# Patient Record
Sex: Female | Born: 1990 | Race: Black or African American | Hispanic: No | Marital: Single | Smoking: Current every day smoker
Health system: Southern US, Community
[De-identification: ages and names within clinical notes are randomized; demographics above are authoritative.]

## PROBLEM LIST (undated history)

## (undated) DIAGNOSIS — J45909 Unspecified asthma, uncomplicated: Secondary | ICD-10-CM

---
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
## 2012-07-11 ENCOUNTER — Encounter (HOSPITAL_COMMUNITY): Payer: Self-pay | Admitting: *Deleted

## 2012-07-11 ENCOUNTER — Emergency Department (HOSPITAL_COMMUNITY)
Admission: EM | Admit: 2012-07-11 | Discharge: 2012-07-11 | Disposition: A | Discharge status: Home / Self Care | Payer: Self-pay | Attending: Emergency Medicine | Admitting: Emergency Medicine

## 2012-07-11 DIAGNOSIS — K089 Disorder of teeth and supporting structures, unspecified: Secondary | ICD-10-CM | POA: Insufficient documentation

## 2012-07-11 DIAGNOSIS — K0889 Other specified disorders of teeth and supporting structures: Secondary | ICD-10-CM

## 2012-07-11 MED ORDER — HYDROCODONE-ACETAMINOPHEN 5-325 MG PO TABS: 1.0000 | ORAL_TABLET | ORAL | Status: DC | PRN

## 2012-07-11 MED ORDER — PENICILLIN V POTASSIUM 500 MG PO TABS: 500.0000 mg | ORAL_TABLET | Freq: Three times a day (TID) | ORAL | Status: DC

## 2012-07-11 MED ORDER — OXYCODONE-ACETAMINOPHEN 5-325 MG PO TABS
1.0000 | ORAL_TABLET | Freq: Once | ORAL | Status: AC
  Filled 2012-07-11: qty 1

## 2012-07-11 NOTE — ED Notes (Signed)
Pt c/o left lower dental pain since this afternoon.  States she has had a cyst in the same area before.

## 2012-07-11 NOTE — ED Provider Notes (Signed)
History     CSN: [id number]  Arrival date & time 07/11/12  7829   First MD Initiated Contact with Patient 07/11/12 0257      Chief Complaint  Patient presents with  . Dental Pain    (Consider location/radiation/quality/duration/timing/severity/associated sxs/prior treatment) HPI History provided by pt.   Pt has had severe pain in left lower tooth since 7pm yesterday.   Has not taken anything for pain.  No associated fever.  Had similar pain several months ago, was diagnosed w/ a dental abscess and followed up with a dentist but has to reschedule her tooth extraction.   History reviewed. No pertinent past medical history.  History reviewed. No pertinent past surgical history.  History reviewed. No pertinent family history.  History  Substance Use Topics  . Smoking status: Never Smoker   . Smokeless tobacco: Not on file  . Alcohol Use: Yes    OB History    Grav Para Term Preterm Abortions TAB SAB Ect Mult Living                  Review of Systems  All other systems reviewed and are negative.    Allergies  Review of patient's allergies indicates no known allergies.  Home Medications   Current Outpatient Rx  Name  Route  Sig  Dispense  Refill  . HYDROCODONE-ACETAMINOPHEN 5-325 MG PO TABS   Oral   Take 1 tablet by mouth every 4 (four) hours as needed for pain.   15 tablet   0   . PENICILLIN V POTASSIUM 500 MG PO TABS   Oral   Take 1 tablet (500 mg total) by mouth 3 (three) times daily.   30 tablet   0     BP 128/94  Pulse 91  Temp 98.4 F (36.9 C) (Oral)  Resp 18  SpO2 100%  Physical Exam  Nursing note and vitals reviewed. Constitutional: She is oriented to person, place, and time. She appears well-developed and well-nourished.  HENT:  Head: Normocephalic and atraumatic. No trismus in the jaw.  Mouth/Throat: Uvula is midline and oropharynx is clear and moist.       Left lower 2nd molar w/ multiple small caries.  Severely ttp. Adjacent gingiva  appears normal.  No edema of buccal mucosa.    Eyes:       Normal appearance  Neck: Normal range of motion. Neck supple.       No submandibular edema  Lymphadenopathy:    She has cervical adenopathy.  Neurological: She is alert and oriented to person, place, and time.  Psychiatric: She has a normal mood and affect. Her behavior is normal.    ED Course  Dental Date/Time: 07/11/2012 4:20 AM Performed by: Otilio Miu Authorized by: Ruby Cola E Consent: Verbal consent obtained. Risks and benefits: risks, benefits and alternatives were discussed Consent given by: patient Local anesthesia used: yes Anesthesia: local infiltration Local anesthetic: bupivacaine 0.5% without epinephrine Anesthetic total: 3 ml Patient sedated: no Patient tolerance: Patient tolerated the procedure well with no immediate complications.   (including critical care time)  Labs Reviewed - No data to display No results found.   1. Pain, dental       MDM  21yo F presents w/ dental pain.  Local bupivicaine administered which provided her with relief.  Will treat for likely periapical abscess w/ penicillin.  Prescribed vicodin for pain.  She has a dentist to f/u with. Return precautions discussed.  Otilio Miu, [place] 07/11/12 [phone]

## 2012-07-11 NOTE — ED Provider Notes (Signed)
Medical screening examination/treatment/procedure(s) were performed by non-physician practitioner and as supervising physician I was immediately available for consultation/collaboration.  Anshul Meddings, MD 07/11/12 0637 

## 2014-10-17 ENCOUNTER — Encounter (HOSPITAL_COMMUNITY): Payer: Self-pay | Admitting: Emergency Medicine

## 2014-10-17 ENCOUNTER — Emergency Department (HOSPITAL_COMMUNITY)
Admission: EM | Admit: 2014-10-17 | Discharge: 2014-10-18 | Disposition: A | Discharge status: Home / Self Care | Payer: Self-pay | Attending: Emergency Medicine | Admitting: Emergency Medicine

## 2014-10-17 DIAGNOSIS — R109 Unspecified abdominal pain: Secondary | ICD-10-CM | POA: Insufficient documentation

## 2014-10-17 DIAGNOSIS — R509 Fever, unspecified: Secondary | ICD-10-CM | POA: Insufficient documentation

## 2014-10-17 DIAGNOSIS — J45909 Unspecified asthma, uncomplicated: Secondary | ICD-10-CM | POA: Insufficient documentation

## 2014-10-17 DIAGNOSIS — Z3202 Encounter for pregnancy test, result negative: Secondary | ICD-10-CM | POA: Insufficient documentation

## 2014-10-17 DIAGNOSIS — R112 Nausea with vomiting, unspecified: Secondary | ICD-10-CM | POA: Insufficient documentation

## 2014-10-17 DIAGNOSIS — Z720 Tobacco use: Secondary | ICD-10-CM | POA: Insufficient documentation

## 2014-10-17 HISTORY — DX: Unspecified asthma, uncomplicated: J45.909

## 2014-10-17 LAB — COMPREHENSIVE METABOLIC PANEL
ALT: 17 U/L (ref 0–35)
ANION GAP: 4 — AB (ref 5–15)
AST: 21 U/L (ref 0–37)
Albumin: 3.9 g/dL (ref 3.5–5.2)
Alkaline Phosphatase: 68 U/L (ref 39–117)
BILIRUBIN TOTAL: 0.4 mg/dL (ref 0.3–1.2)
BUN: 9 mg/dL (ref 6–23)
CALCIUM: 9.1 mg/dL (ref 8.4–10.5)
CO2: 31 mmol/L (ref 19–32)
Chloride: 102 mmol/L (ref 96–112)
Creatinine, Ser: 0.92 mg/dL (ref 0.50–1.10)
GFR calc non Af Amer: 87 mL/min — ABNORMAL LOW (ref >90)
Glucose, Bld: 91 mg/dL (ref 70–99)
Potassium: 3.4 mmol/L — ABNORMAL LOW (ref 3.5–5.1)
SODIUM: 137 mmol/L (ref 135–145)
TOTAL PROTEIN: 7.5 g/dL (ref 6.0–8.3)

## 2014-10-17 LAB — URINALYSIS, ROUTINE W REFLEX MICROSCOPIC
BILIRUBIN URINE: NEGATIVE
GLUCOSE, UA: NEGATIVE mg/dL
HGB URINE DIPSTICK: NEGATIVE
Ketones, ur: NEGATIVE mg/dL
Nitrite: NEGATIVE
PH: 6.5 (ref 5.0–8.0)
Protein, ur: NEGATIVE mg/dL
Specific Gravity, Urine: 1.021 (ref 1.005–1.030)
Urobilinogen, UA: 0.2 mg/dL (ref 0.0–1.0)

## 2014-10-17 LAB — CBC WITH DIFFERENTIAL/PLATELET
Basophils Absolute: 0 10*3/uL (ref 0.0–0.1)
Basophils Relative: 0 % (ref 0–1)
EOS PCT: 3 % (ref 0–5)
Eosinophils Absolute: 0.2 10*3/uL (ref 0.0–0.7)
HEMATOCRIT: 39.8 % (ref 36.0–46.0)
HEMOGLOBIN: 12.7 g/dL (ref 12.0–15.0)
LYMPHS PCT: 43 % (ref 12–46)
Lymphs Abs: 2.8 10*3/uL (ref 0.7–4.0)
MCH: 27 pg (ref 26.0–34.0)
MCHC: 31.9 g/dL (ref 30.0–36.0)
MCV: 84.7 fL (ref 78.0–100.0)
MONO ABS: 0.5 10*3/uL (ref 0.1–1.0)
MONOS PCT: 8 % (ref 3–12)
NEUTROS ABS: 3 10*3/uL (ref 1.7–7.7)
Neutrophils Relative %: 46 % (ref 43–77)
Platelets: 250 10*3/uL (ref 150–400)
RBC: 4.7 MIL/uL (ref 3.87–5.11)
RDW: 14.2 % (ref 11.5–15.5)
WBC: 6.5 10*3/uL (ref 4.0–10.5)

## 2014-10-17 LAB — URINE MICROSCOPIC-ADD ON

## 2014-10-17 LAB — LIPASE, BLOOD: Lipase: 25 U/L (ref 11–59)

## 2014-10-17 LAB — POC URINE PREG, ED: PREG TEST UR: NEGATIVE

## 2014-10-17 MED ORDER — ONDANSETRON 4 MG PO TBDP: 4.0000 mg | ORAL_TABLET | Freq: Three times a day (TID) | ORAL | Status: DC | PRN

## 2014-10-17 MED ORDER — OMEPRAZOLE 20 MG PO CPDR: 20.0000 mg | DELAYED_RELEASE_CAPSULE | Freq: Every day | ORAL | Status: DC

## 2014-10-17 NOTE — Discharge Instructions (Signed)
Please follow up with your primary care physician in 1-2 days. If you do not have one please call the [facility] number listed above. Please read all discharge instructions and return precautions.  ° ° °Nausea and Vomiting °Nausea is a sick feeling that often comes before throwing up (vomiting). Vomiting is a reflex where stomach contents come out of your mouth. Vomiting can cause severe loss of body fluids (dehydration). Children and elderly adults can become dehydrated quickly, especially if they also have diarrhea. Nausea and vomiting are symptoms of a condition or disease. It is important to find the cause of your symptoms. °CAUSES  °· Direct irritation of the stomach lining. This irritation can result from increased acid production (gastroesophageal reflux disease), infection, food poisoning, taking certain medicines (such as nonsteroidal anti-inflammatory drugs), alcohol use, or tobacco use. °· Signals from the brain. These signals could be caused by a headache, heat exposure, an inner ear disturbance, increased pressure in the brain from injury, infection, a tumor, or a concussion, pain, emotional stimulus, or metabolic problems. °· An obstruction in the gastrointestinal tract (bowel obstruction). °· Illnesses such as diabetes, hepatitis, gallbladder problems, appendicitis, kidney problems, cancer, sepsis, atypical symptoms of a heart attack, or eating disorders. °· Medical treatments such as chemotherapy and radiation. °· Receiving medicine that makes you sleep (general anesthetic) during surgery. °DIAGNOSIS °Your caregiver may ask for tests to be done if the problems do not improve after a few days. Tests may also be done if symptoms are severe or if the reason for the nausea and vomiting is not clear. Tests may include: °· Urine tests. °· Blood tests. °· Stool tests. °· Cultures (to look for evidence of infection). °· X-rays or other imaging studies. °Test results can help your  caregiver make decisions about treatment or the need for additional tests. °TREATMENT °You need to stay well hydrated. Drink frequently but in small amounts. You may wish to drink water, sports drinks, clear broth, or eat frozen ice pops or gelatin dessert to help stay hydrated. When you eat, eating slowly may help prevent nausea. There are also some antinausea medicines that may help prevent nausea. °HOME CARE INSTRUCTIONS  °· Take all medicine as directed by your caregiver. °· If you do not have an appetite, do not force yourself to eat. However, you must continue to drink fluids. °· If you have an appetite, eat a normal diet unless your caregiver tells you differently. °¨ Eat a variety of complex carbohydrates (rice, wheat, potatoes, bread), lean meats, yogurt, fruits, and vegetables. °¨ Avoid high-fat foods because they are more difficult to digest. °· Drink enough water and fluids to keep your urine clear or pale yellow. °· If you are dehydrated, ask your caregiver for specific rehydration instructions. Signs of dehydration may include: °¨ Severe thirst. °¨ Dry lips and mouth. °¨ Dizziness. °¨ Dark urine. °¨ Decreasing urine frequency and amount. °¨ Confusion. °¨ Rapid breathing or pulse. °SEEK IMMEDIATE MEDICAL CARE IF:  °· You have blood or brown flecks (like coffee grounds) in your vomit. °· You have black or bloody stools. °· You have a severe headache or stiff neck. °· You are confused. °· You have severe abdominal pain. °· You have chest pain or trouble breathing. °· You do not urinate at least once every 8 hours. °· You develop cold or clammy skin. °· You continue to vomit for longer than 24 to 48 hours. °· You have a fever. °MAKE SURE YOU:  °· Understand these instructions. °·   Will watch your condition. °· Will get help right away if you are not doing well or get worse. °Document Released: 08/02/2005 Document Revised: 10/25/2011 Document Reviewed: 12/30/2010 °ExitCare® Patient Information ©2015  [employer]. This information is not intended to replace advice given to you by your health care provider. Make sure you discuss any questions you have with your health care provider. ° °

## 2014-10-17 NOTE — ED Provider Notes (Signed)
CSN: [id number]     Arrival date & time 10/17/14  1641 History   First MD Initiated Contact with Patient 10/17/14 1716     Chief Complaint  Patient presents with  . Emesis  . Fever  . Abdominal Pain     (Consider location/radiation/quality/duration/timing/severity/associated sxs/prior Treatment) HPI Comments: Patient is a 24 year old female past medical history significant for tobacco abuse, asthma presenting to the emergency department for intermittent episodes of nonbloody nonbilious emesis with associated abdominal cramping since February. Patient states her last episode was at 2:30 PM today. She endorses some relation with food, no provoking factor identified. She's not tried anything at home. Denies any symptoms currently. She has not seen anybody for this yet. Denies any abdominal surgical history. Denies any fevers, chills, diarrhea, vaginal bleeding or discharge, urinary symptoms, constipation.  Patient is a 24 y.o. female presenting with vomiting, fever, and abdominal pain.  Emesis Associated symptoms: abdominal pain   Associated symptoms: no diarrhea   Fever Associated symptoms: nausea and vomiting   Associated symptoms: no diarrhea   Abdominal Pain Associated symptoms: fever, nausea and vomiting   Associated symptoms: no constipation and no diarrhea     Past Medical History  Diagnosis Date  . Asthma    History reviewed. No pertinent past surgical history. No family history on file. History  Substance Use Topics  . Smoking status: Current Every Day Smoker    Types: Cigarettes  . Smokeless tobacco: Not on file  . Alcohol Use: No   OB History    No data available     Review of Systems  Constitutional: Positive for fever.  Gastrointestinal: Positive for nausea, vomiting and abdominal pain. Negative for diarrhea and constipation.  All other systems reviewed and are negative.     Allergies  Review of patient's allergies indicates no known allergies.  Home  Medications   Prior to Admission medications   Medication Sig Start Date End Date Taking? Authorizing Provider  HYDROcodone-acetaminophen (NORCO/VICODIN) 5-325 MG per tablet Take 1 tablet by mouth every 4 (four) hours as needed for pain. Patient not taking: Reported on 10/17/2014 07/11/12   Arie Sabina Schinlever, PA-C  omeprazole (PRILOSEC) 20 MG capsule Take 1 capsule (20 mg total) by mouth daily. 10/17/14   Seven Dollens L Jary Louvier, PA-C  ondansetron (ZOFRAN ODT) 4 MG disintegrating tablet Take 1 tablet (4 mg total) by mouth every 8 (eight) hours as needed for nausea or vomiting. 10/17/14   Lakie Mclouth L Jaasiel Hollyfield, PA-C  penicillin v potassium (VEETID) 500 MG tablet Take 1 tablet (500 mg total) by mouth 3 (three) times daily. Patient not taking: Reported on 10/17/2014 07/11/12   Arie Sabina Schinlever, PA-C   BP 137/84 mmHg  Pulse 49  Temp(Src) 98.8 F (37.1 C)  Resp 18  Wt 208 lb 2 oz (94.405 kg)  SpO2 95%  LMP 08/30/2014 Physical Exam  Constitutional: She is oriented to person, place, and time. She appears well-developed and well-nourished. No distress.  HENT:  Head: Normocephalic and atraumatic.  Right Ear: External ear normal.  Left Ear: External ear normal.  Nose: Nose normal.  Mouth/Throat: Oropharynx is clear and moist. No oropharyngeal exudate.  Eyes: Conjunctivae are normal.  Neck: Normal range of motion. Neck supple.  Cardiovascular: Normal rate, regular rhythm and normal heart sounds.   Pulmonary/Chest: Effort normal and breath sounds normal. No respiratory distress.  Abdominal: Soft. Bowel sounds are normal. She exhibits no distension. There is no tenderness. There is no rebound and no guarding.  Musculoskeletal: Normal  range of motion. She exhibits no edema.  Neurological: She is alert and oriented to person, place, and time.  Skin: Skin is warm and dry. She is not diaphoretic.  Psychiatric: She has a normal mood and affect.  Nursing note and vitals reviewed.   ED Course   Procedures (including critical care time) Medications - No data to display  Labs Review Labs Reviewed  COMPREHENSIVE METABOLIC PANEL - Abnormal; Notable for the following:    Potassium 3.4 (*)    GFR calc non Af Amer 87 (*)    Anion gap 4 (*)    All other components within normal limits  URINALYSIS, ROUTINE W REFLEX MICROSCOPIC - Abnormal; Notable for the following:    APPearance HAZY (*)    Leukocytes, UA SMALL (*)    All other components within normal limits  URINE MICROSCOPIC-ADD ON - Abnormal; Notable for the following:    Squamous Epithelial / LPF MANY (*)    All other components within normal limits  CBC WITH DIFFERENTIAL/PLATELET  LIPASE, BLOOD  POC URINE PREG, ED    Imaging Review No results found.   EKG Interpretation None      MDM   Final diagnoses:  Nausea and vomiting in adult patient    Filed Vitals:   10/17/14 1930  BP: 137/84  Pulse: 49  Temp:   Resp:    Afebrile, NAD, non-toxic appearing, AAOx4.  I have reviewed nursing notes, vital signs, and all appropriate lab and imaging results for this patient.  Patient is nontoxic, nonseptic appearing, in no apparent distress.  Patient's pain and other symptoms adequately managed in emergency department.   Labs, imaging and vitals reviewed.  Patient does not meet the SIRS or Sepsis criteria.  On repeat exam patient does not have a surgical abdomin and there are no peritoneal signs.  No indication of appendicitis, bowel obstruction, bowel perforation, cholecystitis, diverticulitis, PID or ectopic pregnancy.  Heart rate low, but patient asymptomatic with stable BP. Patient discharged home with symptomatic treatment and given strict instructions for follow-up with their primary care physician.  I have also discussed reasons to return immediately to the ER.  Patient expresses understanding and agrees with plan.      Jeannetta EllisJennifer L Khadijatou Borak, PA-C 10/17/14 2122  Juliet RudeNathan R. Rubin PayorPickering, MD 10/18/14 [id number]

## 2014-10-17 NOTE — ED Notes (Signed)
NAD at this time. Pt is stable and leaving with her Godmother.

## 2014-10-17 NOTE — ED Notes (Signed)
PA Jenn at the bedside.

## 2014-10-17 NOTE — ED Notes (Signed)
Pt c/o vomiting starting at 0230 "back to back" unable to keep anything down-- states has been throwing up since February , last period January 16th-- also c/o abd cramping.

## 2014-11-07 ENCOUNTER — Encounter (HOSPITAL_COMMUNITY): Payer: Self-pay | Admitting: *Deleted

## 2014-11-07 ENCOUNTER — Inpatient Hospital Stay (HOSPITAL_COMMUNITY)
Admission: AD | Admit: 2014-11-07 | Discharge: 2014-11-07 | Disposition: A | Discharge status: Home / Self Care | Payer: Self-pay | Source: Ambulatory Visit | Attending: Family Medicine | Admitting: Family Medicine

## 2014-11-07 DIAGNOSIS — F17210 Nicotine dependence, cigarettes, uncomplicated: Secondary | ICD-10-CM | POA: Insufficient documentation

## 2014-11-07 DIAGNOSIS — N760 Acute vaginitis: Secondary | ICD-10-CM

## 2014-11-07 DIAGNOSIS — B9689 Other specified bacterial agents as the cause of diseases classified elsewhere: Secondary | ICD-10-CM | POA: Insufficient documentation

## 2014-11-07 DIAGNOSIS — A5901 Trichomonal vulvovaginitis: Secondary | ICD-10-CM

## 2014-11-07 DIAGNOSIS — N926 Irregular menstruation, unspecified: Secondary | ICD-10-CM

## 2014-11-07 LAB — WET PREP, GENITAL
Trich, Wet Prep: NONE SEEN
Yeast Wet Prep HPF POC: NONE SEEN

## 2014-11-07 LAB — URINALYSIS, ROUTINE W REFLEX MICROSCOPIC
BILIRUBIN URINE: NEGATIVE
Glucose, UA: NEGATIVE mg/dL
KETONES UR: NEGATIVE mg/dL
Nitrite: NEGATIVE
PH: 6 (ref 5.0–8.0)
PROTEIN: NEGATIVE mg/dL
Specific Gravity, Urine: 1.015 (ref 1.005–1.030)
Urobilinogen, UA: 0.2 mg/dL (ref 0.0–1.0)

## 2014-11-07 LAB — URINE MICROSCOPIC-ADD ON

## 2014-11-07 LAB — POCT PREGNANCY, URINE: Preg Test, Ur: NEGATIVE

## 2014-11-07 MED ORDER — METRONIDAZOLE 500 MG PO TABS: 500.0000 mg | ORAL_TABLET | Freq: Two times a day (BID) | ORAL | Status: DC

## 2014-11-07 NOTE — MAU Note (Signed)
Pt states LMP-08/31/2014. No prior hx of irregular cycles. Intermittent spotting x1 week. Also had lower abdominal pain. No pain at present.

## 2014-11-07 NOTE — MAU Provider Note (Signed)
History     CSN: [id number]  Arrival date and time: 11/07/14 1358   None     No chief complaint on file.  Kayla Richmond  HPI Comments: Kayla Richmond is a 24 y.o. female G0P0000 who presents with Lower abdominal cramping that started more than a month ago.   Abdominal Pain This is a new problem. The current episode started more than 1 month ago. The onset quality is gradual. The problem occurs constantly. The pain is located in the suprapubic region. The pain is at a severity of 5/10. The quality of the pain is cramping. Associated symptoms include nausea and vomiting. Pertinent negatives include no dysuria, fever, frequency or hematuria. She has tried nothing for the symptoms.    Her last menstrual was in January and she is concerned that she may be pregnant, she has never had an irregular cycle before.   She has had the same partner for almost a year; and is not concerned about STD   OB History    Gravida Para Term Preterm AB TAB SAB Ectopic Multiple Living   0 0 0 0 0 0 0 0 0 0       Past Medical History  Diagnosis Date  . Asthma     History reviewed. No pertinent past surgical history.  History reviewed. No pertinent family history.  History  Substance Use Topics  . Smoking status: Current Every Day Smoker    Types: Cigarettes  . Smokeless tobacco: Not on file  . Alcohol Use: No    Allergies: No Known Allergies  Prescriptions prior to admission  Medication Sig Dispense Refill Last Dose  . HYDROcodone-acetaminophen (NORCO/VICODIN) 5-325 MG per tablet Take 1 tablet by mouth every 4 (four) hours as needed for pain. (Patient not taking: Reported on 10/17/2014) 15 tablet 0 Not Taking at Unknown time  . omeprazole (PRILOSEC) 20 MG capsule Take 1 capsule (20 mg total) by mouth daily. (Patient not taking: Reported on 11/07/2014) 21 capsule 0 Not Taking at Unknown time  . ondansetron (ZOFRAN ODT) 4 MG disintegrating tablet Take 1 tablet (4 mg total) by mouth every 8  (eight) hours as needed for nausea or vomiting. (Patient not taking: Reported on 11/07/2014) 20 tablet 0 Not Taking at Unknown time  . penicillin v potassium (VEETID) 500 MG tablet Take 1 tablet (500 mg total) by mouth 3 (three) times daily. (Patient not taking: Reported on 10/17/2014) 30 tablet 0 Not Taking at Unknown time   Results for orders placed or performed during the hospital encounter of 11/07/14 (from the past 48 hour(s))  Urinalysis, Routine w reflex microscopic     Status: Abnormal   Collection Time: 11/07/14  3:20 PM  Result Value Ref Range   Color, Urine YELLOW YELLOW   APPearance CLEAR CLEAR   Specific Gravity, Urine 1.015 1.005 - 1.030   pH 6.0 5.0 - 8.0   Glucose, UA NEGATIVE NEGATIVE mg/dL   Hgb urine dipstick LARGE (A) NEGATIVE   Bilirubin Urine NEGATIVE NEGATIVE   Ketones, ur NEGATIVE NEGATIVE mg/dL   Protein, ur NEGATIVE NEGATIVE mg/dL   Urobilinogen, UA 0.2 0.0 - 1.0 mg/dL   Nitrite NEGATIVE NEGATIVE   Leukocytes, UA SMALL (A) NEGATIVE  Urine microscopic-add on     Status: None   Collection Time: 11/07/14  3:20 PM  Result Value Ref Range   Squamous Epithelial / LPF RARE RARE   WBC, UA 0-2 <3 WBC/hpf   Bacteria, UA RARE RARE   Urine-Other TRICHOMONAS PRESENT  Pregnancy, urine POC     Status: None   Collection Time: 11/07/14  3:27 PM  Result Value Ref Range   Preg Test, Ur NEGATIVE NEGATIVE    Comment:        THE SENSITIVITY OF THIS METHODOLOGY IS >24 mIU/mL   Wet prep, genital     Status: Abnormal   Collection Time: 11/07/14  6:30 PM  Result Value Ref Range   Yeast Wet Prep HPF POC NONE SEEN NONE SEEN   Trich, Wet Prep NONE SEEN NONE SEEN   Clue Cells Wet Prep HPF POC MANY (A) NONE SEEN   WBC, Wet Prep HPF POC FEW (A) NONE SEEN    Comment: BACTERIA- TOO NUMEROUS TO COUNT    Review of Systems  Constitutional: Negative for fever and chills.  Gastrointestinal: Positive for nausea, vomiting and abdominal pain.  Genitourinary: Negative for dysuria,  urgency, frequency and hematuria.       Spotting off and on.    Physical Exam   Blood pressure 126/76, pulse 71, temperature 98.4 F (36.9 C), temperature source Oral, resp. rate 16, height  (1.753 m), weight 95.482 kg (210 lb 8 oz), last menstrual period 08/31/2014.  Physical Exam  Constitutional: She is oriented to person, place, and time. She appears well-developed and well-nourished. No distress.  HENT:  Head: Normocephalic.  Eyes: Pupils are equal, round, and reactive to light.  Respiratory: Effort normal.  GI: Soft. She exhibits no distension. There is no tenderness.  Genitourinary:  Speculum exam: Vagina - Small amount of dark red blood in the vaginal canal; menstrual like  Cervix - + active bleeding from os.  Bimanual exam: Cervix closed, no CMT  Uterus non tender, normal size Adnexa non tender, no masses bilaterally GC/Chlam, wet prep done Chaperone present for exam.  Neurological: She is alert and oriented to person, place, and time.  Skin: Skin is warm. She is not diaphoretic.  Psychiatric: Her behavior is normal.    MAU Course  Procedures  None  MDM  GC Wet prep HIV   Assessment and Plan   A:  1. Trichomonas vaginitis   2. BV (bacterial vaginosis)   3. Irregular menstrual cycle    P:  Discharge home in stable condition RX: Flagyl for 7 days- no alcohol  Return to MAU for emergencies Follow up with HD as needed Partner needs treatment; no sex for 7 days.     Duane Lope, NP 11/07/2014 6:22 PM

## 2014-11-07 NOTE — Discharge Instructions (Signed)
Bacterial Vaginosis Bacterial vaginosis is a vaginal infection that occurs when the normal balance of bacteria in the vagina is disrupted. It results from an overgrowth of certain bacteria. This is the most common vaginal infection in women of childbearing age. Treatment is important to prevent complications, especially in pregnant women, as it can cause a premature delivery. CAUSES  Bacterial vaginosis is caused by an increase in harmful bacteria that are normally present in smaller amounts in the vagina. Several different kinds of bacteria can cause bacterial vaginosis. However, the reason that the condition develops is not fully understood. RISK FACTORS Certain activities or behaviors can put you at an increased risk of developing bacterial vaginosis, including:  Having a new sex partner or multiple sex partners.  Douching.  Using an intrauterine device (IUD) for contraception. Women do not get bacterial vaginosis from toilet seats, bedding, swimming pools, or contact with objects around them. SIGNS AND SYMPTOMS  Some women with bacterial vaginosis have no signs or symptoms. Common symptoms include:  Grey vaginal discharge.  A fishlike odor with discharge, especially after sexual intercourse.  Itching or burning of the vagina and vulva.  Burning or pain with urination. DIAGNOSIS  Your health care provider will take a medical history and examine the vagina for signs of bacterial vaginosis. A sample of vaginal fluid may be taken. Your health care provider will look at this sample under a microscope to check for bacteria and abnormal cells. A vaginal pH test may also be done.  TREATMENT  Bacterial vaginosis may be treated with antibiotic medicines. These may be given in the form of a pill or a vaginal cream. A second round of antibiotics may be prescribed if the condition comes back after treatment.  HOME CARE INSTRUCTIONS   Only take over-the-counter or prescription medicines as  directed by your health care provider.  If antibiotic medicine was prescribed, take it as directed. Make sure you finish it even if you start to feel better.  Do not have sex until treatment is completed.  Tell all sexual partners that you have a vaginal infection. They should see their health care provider and be treated if they have problems, such as a mild rash or itching.  Practice safe sex by using condoms and only having one sex partner. SEEK MEDICAL CARE IF:   Your symptoms are not improving after 3 days of treatment.  You have increased discharge or pain.  You have a fever. MAKE SURE YOU:   Understand these instructions.  Will watch your condition.  Will get help right away if you are not doing well or get worse. FOR MORE INFORMATION  Centers for Disease Control and Prevention, Division of STD Prevention: www.cdc.gov/std American Sexual Health Association (ASHA): www.ashastd.org  Document Released: 08/02/2005 Document Revised: 05/23/2013 Document Reviewed: 03/14/2013 ExitCare Patient Information 2015 [employer]. This information is not intended to replace advice given to you by your health care provider. Make sure you discuss any questions you have with your health care provider.  

## 2014-11-07 NOTE — MAU Note (Signed)
Urine in lab 

## 2014-11-08 LAB — GC/CHLAMYDIA PROBE AMP (~~LOC~~) NOT AT ARMC
CHLAMYDIA, DNA PROBE: NEGATIVE
NEISSERIA GONORRHEA: NEGATIVE

## 2014-11-08 LAB — HIV ANTIBODY (ROUTINE TESTING W REFLEX): HIV SCREEN 4TH GENERATION: NONREACTIVE

## 2015-03-12 ENCOUNTER — Encounter (HOSPITAL_BASED_OUTPATIENT_CLINIC_OR_DEPARTMENT_OTHER): Payer: Self-pay

## 2015-03-12 ENCOUNTER — Emergency Department (HOSPITAL_BASED_OUTPATIENT_CLINIC_OR_DEPARTMENT_OTHER)
Admission: EM | Admit: 2015-03-12 | Discharge: 2015-03-13 | Disposition: A | Discharge status: Home / Self Care | Payer: BC Managed Care – PPO | Attending: Emergency Medicine | Admitting: Emergency Medicine

## 2015-03-12 DIAGNOSIS — Z3202 Encounter for pregnancy test, result negative: Secondary | ICD-10-CM | POA: Insufficient documentation

## 2015-03-12 DIAGNOSIS — A499 Bacterial infection, unspecified: Secondary | ICD-10-CM

## 2015-03-12 DIAGNOSIS — J45909 Unspecified asthma, uncomplicated: Secondary | ICD-10-CM | POA: Insufficient documentation

## 2015-03-12 DIAGNOSIS — B9689 Other specified bacterial agents as the cause of diseases classified elsewhere: Secondary | ICD-10-CM | POA: Insufficient documentation

## 2015-03-12 DIAGNOSIS — Z720 Tobacco use: Secondary | ICD-10-CM | POA: Insufficient documentation

## 2015-03-12 DIAGNOSIS — N390 Urinary tract infection, site not specified: Secondary | ICD-10-CM | POA: Insufficient documentation

## 2015-03-12 LAB — URINALYSIS, ROUTINE W REFLEX MICROSCOPIC
BILIRUBIN URINE: NEGATIVE
Glucose, UA: NEGATIVE mg/dL
Ketones, ur: NEGATIVE mg/dL
Nitrite: POSITIVE — AB
PH: 6 (ref 5.0–8.0)
PROTEIN: 100 mg/dL — AB
SPECIFIC GRAVITY, URINE: 1.012 (ref 1.005–1.030)
Urobilinogen, UA: 1 mg/dL (ref 0.0–1.0)

## 2015-03-12 LAB — URINE MICROSCOPIC-ADD ON

## 2015-03-12 LAB — PREGNANCY, URINE: Preg Test, Ur: NEGATIVE

## 2015-03-12 NOTE — ED Notes (Addendum)
Patient reports bilateral flank pain and "orange vomit".  Reports "I have been vomiting for 1 week".  Reports today is the only day she vomited orange.  Reports for the past 3 days "I just keep throwing up liquid".  Denies dysuria, hematuria.

## 2015-03-12 NOTE — ED Notes (Signed)
Vomiting x  1 week-bilat flank pain x today

## 2015-03-13 MED ORDER — CEPHALEXIN 500 MG PO CAPS: 500.0000 mg | ORAL_CAPSULE | Freq: Four times a day (QID) | ORAL | Status: DC

## 2015-03-13 MED ORDER — MORPHINE SULFATE 4 MG/ML IJ SOLN
4.0000 mg | Freq: Once | INTRAMUSCULAR | Status: AC
  Administered 2015-03-13: 4 mg via INTRAMUSCULAR
  Filled 2015-03-13: qty 1

## 2015-03-13 MED ORDER — CEFTRIAXONE SODIUM 1 G IJ SOLR
1.0000 g | Freq: Once | INTRAMUSCULAR | Status: AC
  Administered 2015-03-13: 1 g via INTRAMUSCULAR
  Filled 2015-03-13: qty 10

## 2015-03-13 MED ORDER — HYDROCODONE-ACETAMINOPHEN 5-325 MG PO TABS: 1.0000 | ORAL_TABLET | Freq: Four times a day (QID) | ORAL | Status: DC | PRN

## 2015-03-13 MED ORDER — MORPHINE SULFATE 4 MG/ML IJ SOLN
INTRAMUSCULAR | Status: AC
  Filled 2015-03-13: qty 1

## 2015-03-13 NOTE — ED Notes (Signed)
MD at bedside. 

## 2015-03-13 NOTE — ED Provider Notes (Signed)
CSN: [id number]     Arrival date & time 03/12/15  2218 History   First MD Initiated Contact with Patient 03/13/15 0020     Chief Complaint  Patient presents with  . Emesis     (Consider location/radiation/quality/duration/timing/severity/associated sxs/prior Treatment) HPI Comments: Patient presents with a 1 week history of low back discomfort, nausea, vomiting, and generalized malaise.  Patient is a 24 y.o. female presenting with vomiting. The history is provided by the patient.  Emesis Severity:  Moderate Duration:  1 week Timing:  Constant Progression:  Worsening Chronicity:  New Recent urination:  Decreased Relieved by:  Nothing Worsened by:  Nothing tried Ineffective treatments:  None tried Associated symptoms: no abdominal pain, no chills and no fever     Past Medical History  Diagnosis Date  . Asthma    History reviewed. No pertinent past surgical history. No family history on file. History  Substance Use Topics  . Smoking status: Current Every Day Smoker    Types: Cigarettes  . Smokeless tobacco: Not on file  . Alcohol Use: No   OB History    Gravida Para Term Preterm AB TAB SAB Ectopic Multiple Living       Review of Systems  Constitutional: Negative for chills.  Gastrointestinal: Positive for vomiting. Negative for abdominal pain.  All other systems reviewed and are negative.     Allergies  Review of patient's allergies indicates no known allergies.  Home Medications   Prior to Admission medications   Medication Sig Start Date End Date Taking? Authorizing Provider  cephALEXin (KEFLEX) 500 MG capsule Take 1 capsule (500 mg total) by mouth 4 (four) times daily. 03/13/15   Geoffery Lyons, MD  HYDROcodone-acetaminophen (NORCO) 5-325 MG per tablet Take 1-2 tablets by mouth every 6 (six) hours as needed. 03/13/15   Geoffery Lyons, MD   BP 137/87 mmHg  Pulse 76  Temp(Src) 98.3 F (36.8 C) (Oral)  Resp 16  Ht 5' 8.5" (1.74 m)  Wt 206  lb 9 oz (93.696 kg)  BMI 30.95 kg/m2  SpO2 99%  LMP 02/12/2015 Physical Exam  Constitutional: She is oriented to person, place, and time. She appears well-developed and well-nourished. No distress.  HENT:  Head: Normocephalic and atraumatic.  Neck: Normal range of motion. Neck supple.  Cardiovascular: Normal rate and regular rhythm.  Exam reveals no gallop and no friction rub.   No murmur heard. Pulmonary/Chest: Effort normal and breath sounds normal. No respiratory distress. She has no wheezes.  Abdominal: Soft. Bowel sounds are normal. She exhibits no distension and no mass. There is no tenderness. There is no rebound and no guarding.  Musculoskeletal: Normal range of motion.  Neurological: She is alert and oriented to person, place, and time.  Skin: Skin is warm and dry. She is not diaphoretic.  Nursing note and vitals reviewed.   ED Course  Procedures (including critical care time) Labs Review Labs Reviewed  URINALYSIS, ROUTINE W REFLEX MICROSCOPIC (NOT AT [facility]) - Abnormal; Notable for the following:    Color, Urine ORANGE (*)    APPearance CLOUDY (*)    Hgb urine dipstick LARGE (*)    Protein, ur 100 (*)    Nitrite POSITIVE (*)    Leukocytes, UA LARGE (*)    All other components within normal limits  URINE MICROSCOPIC-ADD ON - Abnormal; Notable for the following:    Squamous Epithelial / LPF FEW (*)    Bacteria, UA MANY (*)  All other components within normal limits  PREGNANCY, URINE    Imaging Review No results found.   EKG Interpretation None      MDM   Final diagnoses:  UTI (urinary tract infection), bacterial    UA reveals a UTI with positive nitrite, large leukocytes, too numerous to count whites, and many bacteria. This will be treated with antibiotics, pain medication, and when necessary return. She is nontoxic appearing and I believe appropriate for discharge.    Geoffery Lyons, MD 03/13/15 [id number]

## 2015-03-13 NOTE — Discharge Instructions (Signed)
Keflex as prescribed.  Hydrocodone as prescribed as needed for pain.  Return to the ER symptoms significantly worsen or change.   Urinary Tract Infection Urinary tract infections (UTIs) can develop anywhere along your urinary tract. Your urinary tract is your body's drainage system for removing wastes and extra water. Your urinary tract includes two kidneys, two ureters, a bladder, and a urethra. Your kidneys are a pair of bean-shaped organs. Each kidney is about the size of your fist. They are located below your ribs, one on each side of your spine. CAUSES Infections are caused by microbes, which are microscopic organisms, including fungi, viruses, and bacteria. These organisms are so small that they can only be seen through a microscope. Bacteria are the microbes that most commonly cause UTIs. SYMPTOMS  Symptoms of UTIs may vary by age and gender of the patient and by the location of the infection. Symptoms in young women typically include a frequent and intense urge to urinate and a painful, burning feeling in the bladder or urethra during urination. Older women and men are more likely to be tired, shaky, and weak and have muscle aches and abdominal pain. A fever may mean the infection is in your kidneys. Other symptoms of a kidney infection include pain in your back or sides below the ribs, nausea, and vomiting. DIAGNOSIS To diagnose a UTI, your caregiver will ask you about your symptoms. Your caregiver also will ask to provide a urine sample. The urine sample will be tested for bacteria and white blood cells. White blood cells are made by your body to help fight infection. TREATMENT  Typically, UTIs can be treated with medication. Because most UTIs are caused by a bacterial infection, they usually can be treated with the use of antibiotics. The choice of antibiotic and length of treatment depend on your symptoms and the type of bacteria causing your infection. HOME CARE INSTRUCTIONS  If you  were prescribed antibiotics, take them exactly as your caregiver instructs you. Finish the medication even if you feel better after you have only taken some of the medication.  Drink enough water and fluids to keep your urine clear or pale yellow.  Avoid caffeine, tea, and carbonated beverages. They tend to irritate your bladder.  Empty your bladder often. Avoid holding urine for long periods of time.  Empty your bladder before and after sexual intercourse.  After a bowel movement, women should cleanse from front to back. Use each tissue only once. SEEK MEDICAL CARE IF:   You have back pain.  You develop a fever.  Your symptoms do not begin to resolve within 3 days. SEEK IMMEDIATE MEDICAL CARE IF:   You have severe back pain or lower abdominal pain.  You develop chills.  You have nausea or vomiting.  You have continued burning or discomfort with urination. MAKE SURE YOU:   Understand these instructions.  Will watch your condition.  Will get help right away if you are not doing well or get worse. Document Released: 05/12/2005 Document Revised: 02/01/2012 Document Reviewed: 09/10/2011 [facility] Patient Information 2015 [place], [place]. This information is not intended to replace advice given to you by your health care provider. Make sure you discuss any questions you have with your health care provider.

## 2015-04-17 ENCOUNTER — Encounter (HOSPITAL_BASED_OUTPATIENT_CLINIC_OR_DEPARTMENT_OTHER): Payer: Self-pay

## 2015-04-17 ENCOUNTER — Emergency Department (HOSPITAL_BASED_OUTPATIENT_CLINIC_OR_DEPARTMENT_OTHER)
Admission: EM | Admit: 2015-04-17 | Discharge: 2015-04-18 | Disposition: A | Discharge status: Home / Self Care | Payer: BC Managed Care – PPO | Attending: Emergency Medicine | Admitting: Emergency Medicine

## 2015-04-17 DIAGNOSIS — Z3202 Encounter for pregnancy test, result negative: Secondary | ICD-10-CM | POA: Insufficient documentation

## 2015-04-17 DIAGNOSIS — R112 Nausea with vomiting, unspecified: Secondary | ICD-10-CM

## 2015-04-17 DIAGNOSIS — Z720 Tobacco use: Secondary | ICD-10-CM | POA: Insufficient documentation

## 2015-04-17 DIAGNOSIS — J45909 Unspecified asthma, uncomplicated: Secondary | ICD-10-CM | POA: Insufficient documentation

## 2015-04-17 LAB — URINALYSIS, ROUTINE W REFLEX MICROSCOPIC
Bilirubin Urine: NEGATIVE
Glucose, UA: NEGATIVE mg/dL
Hgb urine dipstick: NEGATIVE
Ketones, ur: NEGATIVE mg/dL
LEUKOCYTES UA: NEGATIVE
Nitrite: NEGATIVE
PROTEIN: NEGATIVE mg/dL
SPECIFIC GRAVITY, URINE: 1.011 (ref 1.005–1.030)
UROBILINOGEN UA: 0.2 mg/dL (ref 0.0–1.0)
pH: 7 (ref 5.0–8.0)

## 2015-04-17 LAB — PREGNANCY, URINE: PREG TEST UR: NEGATIVE

## 2015-04-17 NOTE — ED Notes (Signed)
C/o n/v, left side abd pain x "couple months"

## 2015-04-18 LAB — COMPREHENSIVE METABOLIC PANEL
ALBUMIN: 3.7 g/dL (ref 3.5–5.0)
ALT: 12 U/L — ABNORMAL LOW (ref 14–54)
ANION GAP: 5 (ref 5–15)
AST: 18 U/L (ref 15–41)
Alkaline Phosphatase: 67 U/L (ref 38–126)
BILIRUBIN TOTAL: 0.3 mg/dL (ref 0.3–1.2)
BUN: 8 mg/dL (ref 6–20)
CO2: 29 mmol/L (ref 22–32)
Calcium: 8.8 mg/dL — ABNORMAL LOW (ref 8.9–10.3)
Chloride: 104 mmol/L (ref 101–111)
Creatinine, Ser: 0.9 mg/dL (ref 0.44–1.00)
GFR calc Af Amer: >60 mL/min (ref >60)
GFR calc non Af Amer: >60 mL/min (ref >60)
GLUCOSE: 96 mg/dL (ref 65–99)
POTASSIUM: 3.7 mmol/L (ref 3.5–5.1)
SODIUM: 138 mmol/L (ref 135–145)
TOTAL PROTEIN: 7.2 g/dL (ref 6.5–8.1)

## 2015-04-18 LAB — CBC WITH DIFFERENTIAL/PLATELET
BASOS PCT: 0 % (ref 0–1)
Basophils Absolute: 0 10*3/uL (ref 0.0–0.1)
EOS ABS: 0.3 10*3/uL (ref 0.0–0.7)
EOS PCT: 4 % (ref 0–5)
HCT: 37.7 % (ref 36.0–46.0)
Hemoglobin: 12.2 g/dL (ref 12.0–15.0)
Lymphocytes Relative: 43 % (ref 12–46)
Lymphs Abs: 2.9 10*3/uL (ref 0.7–4.0)
MCH: 27.5 pg (ref 26.0–34.0)
MCHC: 32.4 g/dL (ref 30.0–36.0)
MCV: 85.1 fL (ref 78.0–100.0)
MONO ABS: 0.6 10*3/uL (ref 0.1–1.0)
MONOS PCT: 9 % (ref 3–12)
Neutro Abs: 3.1 10*3/uL (ref 1.7–7.7)
Neutrophils Relative %: 44 % (ref 43–77)
Platelets: 208 10*3/uL (ref 150–400)
RBC: 4.43 MIL/uL (ref 3.87–5.11)
RDW: 13.5 % (ref 11.5–15.5)
WBC: 6.9 10*3/uL (ref 4.0–10.5)

## 2015-04-18 LAB — RAPID URINE DRUG SCREEN, HOSP PERFORMED
Amphetamines: NOT DETECTED
BARBITURATES: NOT DETECTED
BENZODIAZEPINES: NOT DETECTED
COCAINE: NOT DETECTED
Opiates: NOT DETECTED
Tetrahydrocannabinol: NOT DETECTED

## 2015-04-18 LAB — LIPASE, BLOOD: Lipase: 21 U/L — ABNORMAL LOW (ref 22–51)

## 2015-04-18 MED ORDER — ONDANSETRON 4 MG PO TBDP
4.0000 mg | ORAL_TABLET | Freq: Once | ORAL | Status: AC
  Administered 2015-04-18: 4 mg via ORAL
  Filled 2015-04-18: qty 1

## 2015-04-18 MED ORDER — PANTOPRAZOLE SODIUM 40 MG PO TBEC
40.0000 mg | DELAYED_RELEASE_TABLET | Freq: Once | ORAL | Status: AC
  Administered 2015-04-18: 40 mg via ORAL
  Filled 2015-04-18: qty 1

## 2015-04-18 MED ORDER — SODIUM CHLORIDE 0.9 % IV BOLUS (SEPSIS)
1000.0000 mL | Freq: Once | INTRAVENOUS | Status: AC
  Administered 2015-04-18: 1000 mL via INTRAVENOUS

## 2015-04-18 MED ORDER — ONDANSETRON 4 MG PO TBDP: 4.0000 mg | ORAL_TABLET | Freq: Three times a day (TID) | ORAL | Status: DC | PRN

## 2015-04-18 MED ORDER — GI COCKTAIL ~~LOC~~
30.0000 mL | Freq: Once | ORAL | Status: AC
  Administered 2015-04-18: 30 mL via ORAL
  Filled 2015-04-18: qty 30

## 2015-04-18 MED ORDER — PROMETHAZINE HCL 25 MG/ML IJ SOLN
25.0000 mg | Freq: Once | INTRAMUSCULAR | Status: AC
  Administered 2015-04-18: 25 mg via INTRAMUSCULAR
  Filled 2015-04-18: qty 1

## 2015-04-18 MED ORDER — ONDANSETRON HCL 4 MG/2ML IJ SOLN
4.0000 mg | Freq: Once | INTRAMUSCULAR | Status: AC
  Administered 2015-04-18: 4 mg via INTRAVENOUS
  Filled 2015-04-18: qty 2

## 2015-04-18 MED ORDER — PROMETHAZINE HCL 25 MG/ML IJ SOLN
25.0000 mg | INTRAMUSCULAR | Status: DC | PRN
  Administered 2015-04-18: 25 mg via INTRAVENOUS
  Filled 2015-04-18: qty 1

## 2015-04-18 MED ORDER — PROMETHAZINE HCL 25 MG PO TABS: 25.0000 mg | ORAL_TABLET | Freq: Four times a day (QID) | ORAL | Status: DC | PRN

## 2015-04-18 NOTE — Discharge Instructions (Signed)
Nausea and Vomiting °Nausea is a sick feeling that often comes before throwing up (vomiting). Vomiting is a reflex where stomach contents come out of your mouth. Vomiting can cause severe loss of body fluids (dehydration). Children and elderly adults can become dehydrated quickly, especially if they also have diarrhea. Nausea and vomiting are symptoms of a condition or disease. It is important to find the cause of your symptoms. °CAUSES  °· Direct irritation of the stomach lining. This irritation can result from increased acid production (gastroesophageal reflux disease), infection, food poisoning, taking certain medicines (such as nonsteroidal anti-inflammatory drugs), alcohol use, or tobacco use. °· Signals from the brain. These signals could be caused by a headache, heat exposure, an inner ear disturbance, increased pressure in the brain from injury, infection, a tumor, or a concussion, pain, emotional stimulus, or metabolic problems. °· An obstruction in the gastrointestinal tract (bowel obstruction). °· Illnesses such as diabetes, hepatitis, gallbladder problems, appendicitis, kidney problems, cancer, sepsis, atypical symptoms of a heart attack, or eating disorders. °· Medical treatments such as chemotherapy and radiation. °· Receiving medicine that makes you sleep (general anesthetic) during surgery. °DIAGNOSIS °Your caregiver may ask for tests to be done if the problems do not improve after a few days. Tests may also be done if symptoms are severe or if the reason for the nausea and vomiting is not clear. Tests may include: °· Urine tests. °· Blood tests. °· Stool tests. °· Cultures (to look for evidence of infection). °· X-rays or other imaging studies. °Test results can help your caregiver make decisions about treatment or the need for additional tests. °TREATMENT °You need to stay well hydrated. Drink frequently but in small amounts. You may wish to drink water, sports drinks, clear broth, or eat frozen  ice pops or gelatin dessert to help stay hydrated. When you eat, eating slowly may help prevent nausea. There are also some antinausea medicines that may help prevent nausea. °HOME CARE INSTRUCTIONS  °· Take all medicine as directed by your caregiver. °· If you do not have an appetite, do not force yourself to eat. However, you must continue to drink fluids. °· If you have an appetite, eat a normal diet unless your caregiver tells you differently. °¨ Eat a variety of complex carbohydrates (rice, wheat, potatoes, bread), lean meats, yogurt, fruits, and vegetables. °¨ Avoid high-fat foods because they are more difficult to digest. °· Drink enough water and fluids to keep your urine clear or pale yellow. °· If you are dehydrated, ask your caregiver for specific rehydration instructions. Signs of dehydration may include: °¨ Severe thirst. °¨ Dry lips and mouth. °¨ Dizziness. °¨ Dark urine. °¨ Decreasing urine frequency and amount. °¨ Confusion. °¨ Rapid breathing or pulse. °SEEK IMMEDIATE MEDICAL CARE IF:  °· You have blood or brown flecks (like coffee grounds) in your vomit. °· You have black or bloody stools. °· You have a severe headache or stiff neck. °· You are confused. °· You have severe abdominal pain. °· You have chest pain or trouble breathing. °· You do not urinate at least once every 8 hours. °· You develop cold or clammy skin. °· You continue to vomit for longer than 24 to 48 hours. °· You have a fever. °MAKE SURE YOU:  °· Understand these instructions. °· Will watch your condition. °· Will get help right away if you are not doing well or get worse. °Document Released: 08/02/2005 Document Revised: 10/25/2011 Document Reviewed: 12/30/2010 °Exit[employer]® Patient Information ©2015 [employer]. This information is not intended   to replace advice given to you by your health care provider. Make sure you discuss any questions you have with your health care provider. ° ° °Emergency Department Resource Guide °1) Find a  Doctor and Pay Out of Pocket °Although you won't have to find out who is covered by your insurance plan, it is a good idea to ask around and get recommendations. You will then need to call the office and see if the doctor you have chosen will accept you as a new patient and what types of options they offer for patients who are self-pay. Some doctors offer discounts or will set up payment plans for their patients who do not have insurance, but you will need to ask so you aren't surprised when you get to your appointment. ° °2) Contact Your Local Health Department °Not all health departments have doctors that can see patients for sick visits, but many do, so it is worth a call to see if yours does. If you don't know where your local health department is, you can check in your phone book. The CDC also has a tool to help you locate your state's health department, and many state websites also have listings of all of their local health departments. ° °3) Find a Walk-in Clinic °If your illness is not likely to be very severe or complicated, you may want to try a walk in clinic. These are popping up all over the country in pharmacies, drugstores, and shopping centers. They're usually staffed by nurse practitioners or physician assistants that have been trained to treat common illnesses and complaints. They're usually fairly quick and inexpensive. However, if you have serious medical issues or chronic medical problems, these are probably not your best option. ° °No Primary Care Doctor: °- Call [facility] Connect at  832-8000 - they can help you locate a primary care doctor that  accepts your insurance, provides certain services, etc. °- Physician Referral Service- 1-800-533-3463 ° °Chronic Pain Problems: °Organization         Address  Phone   Notes  °[facility]  (336) 297-2271 Patients need to be referred by their primary care doctor.  ° °Medication Assistance: °Organization         Address  Phone    Notes  °[place] [facility] [address] °[place], [place] [address] (336) 641-8030 --Must be a resident of [place] °-- Must have NO insurance coverage whatsoever (no Medicaid/ Medicare, etc.) °-- The pt. MUST have a primary care doctor that directs their care regularly and follows them in the community °  °[facility]  (866) 331-1348   °[employer]  (888) 892-1162   ° °Agencies that provide inexpensive medical care: °Organization         Address  Phone   Notes  °[facility]  (336) 832-8035   ° Internal Medicine    (336) 832-7272   °[facility] [address] °[place], [place] [address] (336) 832-4777   °[facility] [address], °[place] (336) 271-4999   °[facility]    (336) 373-0678   °[facility]    (336) 272-1050   °[facility] ° [address], [place] Phone:  (336) 832-4444, Fax:  (336) 832-4440 Hours of Operation:  9 am - 6 pm, M-F.  Also accepts Medicaid/Medicare and self-pay.  °[facility] ° [address], [place]   Phone: (336) 832-3150, Fax: (336) 832-3151. Hours of Operation:  8:30 am - 5:30 pm, M-F.  Also accepts Medicaid and self-pay.  °[facility] [place] [address], [place] Phone: (336) 878-6027   °[facility] [address], [place], [place] (336)723-1848, Ext. 123 Mondays & Thursdays: 7-9 AM.  First 15 patients are seen on a first come, first serve basis. °  ° °Medicaid-accepting [place] Providers: ° °Organization         Address  Phone   Notes  °[facility] [address], [place] (336) 641-2100 Also accepts self-pay patients.  °[facility] [address], [place] ° (336) 856-9996   °[facility] [address], [place] (336) 288-8857   °[facility] [address] [place] [address], [place] (336) 299-7000   °Veita Bland [address], [place]  ° (336) 373-1557 Only accepts [place] Access Medicaid patients after they have their name applied to their card.  ° °Self-Pay (no insurance) in [place]: ° °Organization         Address  Phone   Notes  °Sickle Cell Patients, [facility] [address], [place] (336) 832-1970   °[facility] [address], [place] (336) 832-4400   °[facility] [place] ° [address], [place] (336) 992-4800   °[facility]/Dr. Osei-Bonsu ° [address] [place] [address], [place] or [address], [place] (336) 841-8500 Phone number for both [place] and [place] locations is the same.  °[facility] [address], [place] (336) 299-0000   °[facility] [address] [place] [address], [place] or [address] (336) 852-7530 °(336) 878-2260   °[facility] [address], [place] (336) 350-1642, phone; (336) 294-5005, fax Sees patients 1st and 3rd Saturday of every month.  Must not qualify for public or private insurance (i.e. Medicaid, Medicare, [place] Health Choice, Veterans' Benefits) • Household income should be no more than 200% of the poverty level •The clinic cannot treat you if you are pregnant or think you are pregnant • Sexually transmitted diseases are not treated at the clinic.  ° ° °Dental Care: °Organization         Address  Phone  Notes  °[facility] [facility] [address], [place] (336) 641-6152 Accepts children up to age 21 who are enrolled in Medicaid or [place] Health Choice; pregnant women with a Medicaid card; and children who have applied for Medicaid or [place] Health Choice, but were declined, whose parents can pay a reduced fee at time of service.  °[facility] [place]  [address], [place] (336) 641-7733 Accepts children up to age 21 who are enrolled in Medicaid or [place] Health Choice; pregnant women with a Medicaid card; and children who have applied for Medicaid or [place] Health Choice, but were declined, whose parents can pay a reduced fee at time of service.  °[facility] ° [address], [place] (336) 641-4533 Patients are seen by appointment only. Walk-ins are not accepted. [facility] will see patients 18 years of age and older. °Monday - Tuesday (8am-5pm) °Most Wednesdays (8:30-5pm) °$30 per visit, cash only  °[facility] ° [address], [place] (336) 641-4533 Patients are seen by appointment only. Walk-ins are not accepted. [facility] will see patients 18 years of age and older. °One Wednesday Evening (Monthly: Volunteer Based).  $30 per visit, cash only  °[facility]  (919) 537-3737 for adults; Children under age 4, call Graduate Pediatric Dentistry at (919) [phone]. Children aged 4-14, please call (919) [phone] to request a pediatric application. ° Dental services are provided in all areas of dental care including fillings, crowns and bridges, complete and partial dentures, implants, gum treatment, root canals, and extractions. Preventive care is also provided. Treatment is provided to both adults and children. °Patients are selected via a lottery and there is often a waiting list. °  °[facility] [address], °[place] ° (336) 763-8833 [email or web address] °  °[facility] [address], [place], [place] (336)723-1848, Ext. 123 Second and Fourth Thursday of each month, opens at 6:30 AM; Clinic ends at 9 AM.  Patients are seen on a first-come first-served basis, and a limited number are seen during each clinic.  ° °[facility] ° [address], [place], [place] (336) 723-7904   Eligibility Requirements °You must have lived in  [place], [place], or [place] counties for at least the last three months. °  You cannot be eligible for state or federal sponsored healthcare insurance, including Veterans Administration, Medicaid, or Medicare. °  You generally cannot be eligible for healthcare insurance through your employer.  °  How to apply: °Eligibility screenings are held every Tuesday and Wednesday afternoon from 1:00 pm until 4:00 pm. You do not need an appointment for the interview!  °[facility] [address], [place], [place] 336-631-2330   °[facility] [facility]  336-342-8273   °[place] [facility]  336-703-3100   °[facility]  336-570-6415   ° °Behavioral Health Resources in the Community: °Intensive Outpatient Programs °Organization         Address  Phone  Notes  °[facility] [address], [place], [place] 336-878-6098   °[facility] [address], [place], [place] 336-832-9800   °[facility] [address], [place], [place] ° 336-882-2125   °[facility] [address],  °[place], [place] 1-800-853-5163 or 336-641-4981   °Substance Abuse Resources °Organization         Address  Phone  Notes  °[facility]  336-882-2125   °[facility]  336-784-9470   °[facility]  336-285-9073   °[facility]  336-845-3988   °Residential & Outpatient Substance Abuse Program  1-800-659-3381   °Psychological Services °Organization         Address  Phone  Notes  °[facility]  336- 832-9600   °[facility]  336- 378-7881   °[facility] [address], [place] 1-800-853-5163 or 336-641-4981   ° °Mobile Crisis Teams °Organization         Address  Phone  Notes  °[facility], Mobile Crisis Care Unit  1-877-626-1772   °Assertive °[facility] ° [address] [place], [place] 336-834-9664   °Sharon DeEsch [address] °[place] 336-554-5454   ° °Self-Help/Support Groups °Organization         Address  Phone             Notes  °[facility]  - variety of   support groups  336- 373-1402 Call for more information  °Narcotics Anonymous (NA), [facility] [address], °[place]  2 meetings at this location  ° °Residential Treatment Programs °Organization         Address  Phone  Notes  °[facility] [address],    °[place]  1-866-801-8205   °[facility] ° [address], [place], [place] 704-293-8524   °[facility] [address], [place] 336-845-3988 Admissions: 8am-3pm M-F  °[facility] [address]    °[place], [place] 336-841-1104   °[facility] [address], [place], [place] 336-379-7146   °[facility] [address]  °[place], [place] 336-285-9073   °[facility] - Intensive Outpatient [address], [place], [place] 336-852-3033   °[facility] [address]  °[place], [place] 1-877-615-2722 or 336-784-9470   °[facility] [address] [place], [place] 336-227-7417 Accepts Medicaid  °[facility] [address]  °[place] 1-800-659-3381 Substance Abuse/Addiction Treatment  ° °[place] Behavioral Health Resources °Organization         Address  Phone  Notes  °[facility]  (888) 581-9988   °Julie Brannon, PhD [address] [place], [place]   (336) 349-5553 or (336) 951-0000   °[facility]   [address] °[place], [place] (336) 349-4454   °[facility] [address], [place], [place] (336) 342-8316 Insurance/Medicaid/sponsorship through [facility]  °[facility] [address]                                    [place], [place] (336) 342-8316 Therapy/tele-psych/case  °[facility] [address]  ° [place], [place] (336)  349-2233    °Dr. Arfeen  (336) 349-4544   °[facility] [place]  [employer] [place] [facility] 1) [address], [place] °2) [address], [place] °3)  [address], [place] (336) 349-3220 °(336) 342-7768 ° °(336) 342-8140   °[place] Child Abuse [facility] (336) 342-1394 or (336) 342-3537 (After Hours)    ° ° ° °

## 2015-04-18 NOTE — ED Provider Notes (Signed)
This chart was scribed for Kayla Maw Yanel Dombrosky, DO by Gwenyth Ober, ED Scribe. This patient was seen in room MH07/MH07 and the patient's care was started at 12:05 AM.   TIME SEEN: 12:05 AM  CHIEF COMPLAINT:  Chief Complaint  Patient presents with  . Emesis   HPI: HPI Comments: Kayla Richmond is a 24 y.o. female who presents to the Emergency Department complaining of intermittent, moderate nausea and vomiting that occurs every few days, started a few months ago and started today. She states left-sided abdominal pain and diarrhea as associated symptoms. Pt has been unable to tolerate food intake today. She last had diarrhea 3 days ago. Pt denies alleviating or aggravating factors. Pt's last menses began 5-6 days ago. Denies vaginal discharge, itching or irritation. She denies a sour, burning or bitter taste in her mouth or known food triggers. Her symptoms do not always start immediately after eating. Pt denies dysuria or hematuria as associated symptoms.  Describes abdominal pain sharp it is now mild and almost gone. It is unclear what her to come to the emergency department tonight given this is been present for several months. Denies prior history of abdominal surgery, sick contacts or recent travel. Does not have a primary care physician.    ROS: See HPI Constitutional: no fever  Eyes: no drainage  ENT: no runny nose   Cardiovascular:  no chest pain  Resp: no SOB  GI: vomiting GU: no dysuria Integumentary: no rash  Allergy: no hives  Musculoskeletal: no leg swelling  Neurological: no slurred speech ROS otherwise negative  PAST MEDICAL HISTORY/PAST SURGICAL HISTORY:  Past Medical History  Diagnosis Date  . Asthma     MEDICATIONS:  Prior to Admission medications   Not on File    ALLERGIES:  No Known Allergies  SOCIAL HISTORY:  Social History  Substance Use Topics  . Smoking status: Current Every Day Smoker    Types: Cigarettes  . Smokeless tobacco: Not on file  . Alcohol  Use: No    FAMILY HISTORY: No family history on file.  EXAM: BP 157/100 mmHg  Pulse 77  Temp(Src) 98.5 F (36.9 C)  Resp 16  Ht 5\' 8"  (1.727 m)  Wt 202 lb (91.627 kg)  BMI 30.72 kg/m2  SpO2 100%  LMP 04/12/2015 CONSTITUTIONAL: Alert and oriented and responds appropriately to questions. Well-appearing; well-nourished; appears well-hydrated, afebrile and non-toxic HEAD: Normocephalic EYES: Conjunctivae clear, PERRL ENT: normal nose; no rhinorrhea; moist mucous membranes; pharynx without lesions noted NECK: Supple, no meningismus, no LAD  CARD: RRR; S1 and S2 appreciated; no murmurs, no clicks, no rubs, no gallops RESP: Normal chest excursion without splinting or tachypnea; breath sounds clear and equal bilaterally; no wheezes, no rhonchi, no rales, no hypoxia or respiratory distress, speaking full sentences ABD/GI: Normal bowel sounds; non-distended; soft, no rebound, no guarding, no peritoneal signs, minimally tender in epigastric region BACK:  The back appears normal and is non-tender to palpation, there is no CVA tenderness EXT: Normal ROM in all joints; non-tender to palpation; no edema; normal capillary refill; no cyanosis, no calf tenderness or swelling    SKIN: Normal color for age and race; warm NEURO: Moves all extremities equally, sensation to light touch intact diffusely, cranial nerves II through XII intact PSYCH: The patient's mood and manner are appropriate. Grooming and personal hygiene are appropriate.  MEDICAL DECISION MAKING: Patient here with complaints of epigastric abdominal pain and continuous vomiting for the past 2 months. On exam however she appears well hydrated  and is hemodynamically stable. Her abdominal exam is relatively benign other than minimal tenderness over the epigastric region. Discussed with patient that this could be GERD, gastritis, gastroparesis. I do not feel she needs emergent imaging at this time. We'll obtain labs, urine and treat  symptomatically with Zofran. We'll fluid challenge patient.  ED PROGRESS: Patient's labs are unremarkable. Urine shows no sign of infection and no ketones. She is not pregnant. She is still vomiting but it appears to be saliva in the trash can rather than true vomit. Will give dose of IM Phenergan and reassess.   1:30 AM  Pt continues to state that she is vomiting. There appears to be saliva in the trash can. I do not see anything that looks like emesis. We'll give her an emesis bag and continue to closely monitor with the door open to see if she is actually vomiting or rather just spitting. We'll give her IV fluids and a dose of IV Phenergan and IV Zofran and reassess. Discussed with patient any drug use which she denies.    2:10 AM  Pt now reports feeling better and is no longer feeling nauseous. I have not seen any vomiting since she's been in the emergency department. We'll discharge with prescriptions for Zofran and Phenergan to take as needed. Urine drug screen is negative. We'll also provide her with gastroenterology and primary care physician follow-up. Discussed return precautions. She verbalized understanding and is comfortable with this plan.  I personally performed the services described in this documentation, which was scribed in my presence. The recorded information has been reviewed and is accurate.    Kayla Maw Leeza Heiner, DO 04/18/15 0210

## 2015-06-07 ENCOUNTER — Emergency Department (HOSPITAL_BASED_OUTPATIENT_CLINIC_OR_DEPARTMENT_OTHER)
Admission: EM | Admit: 2015-06-07 | Discharge: 2015-06-07 | Disposition: A | Discharge status: Home / Self Care | Payer: BC Managed Care – PPO | Attending: Emergency Medicine | Admitting: Emergency Medicine

## 2015-06-07 ENCOUNTER — Encounter (HOSPITAL_BASED_OUTPATIENT_CLINIC_OR_DEPARTMENT_OTHER): Payer: Self-pay | Admitting: *Deleted

## 2015-06-07 ENCOUNTER — Emergency Department (HOSPITAL_BASED_OUTPATIENT_CLINIC_OR_DEPARTMENT_OTHER): Payer: BC Managed Care – PPO

## 2015-06-07 DIAGNOSIS — Z720 Tobacco use: Secondary | ICD-10-CM | POA: Insufficient documentation

## 2015-06-07 DIAGNOSIS — Z3202 Encounter for pregnancy test, result negative: Secondary | ICD-10-CM | POA: Insufficient documentation

## 2015-06-07 DIAGNOSIS — R52 Pain, unspecified: Secondary | ICD-10-CM

## 2015-06-07 DIAGNOSIS — J45909 Unspecified asthma, uncomplicated: Secondary | ICD-10-CM | POA: Insufficient documentation

## 2015-06-07 DIAGNOSIS — M25552 Pain in left hip: Secondary | ICD-10-CM | POA: Insufficient documentation

## 2015-06-07 DIAGNOSIS — M545 Low back pain: Secondary | ICD-10-CM | POA: Insufficient documentation

## 2015-06-07 LAB — URINALYSIS, ROUTINE W REFLEX MICROSCOPIC
Bilirubin Urine: NEGATIVE
GLUCOSE, UA: NEGATIVE mg/dL
HGB URINE DIPSTICK: NEGATIVE
Ketones, ur: NEGATIVE mg/dL
Leukocytes, UA: NEGATIVE
Nitrite: NEGATIVE
Protein, ur: NEGATIVE mg/dL
SPECIFIC GRAVITY, URINE: 1.009 (ref 1.005–1.030)
Urobilinogen, UA: 0.2 mg/dL (ref 0.0–1.0)
pH: 7.5 (ref 5.0–8.0)

## 2015-06-07 LAB — PREGNANCY, URINE: Preg Test, Ur: NEGATIVE

## 2015-06-07 MED ORDER — HYDROCODONE-ACETAMINOPHEN 5-325 MG PO TABS
1.0000 | ORAL_TABLET | Freq: Once | ORAL | Status: AC
  Administered 2015-06-07: 1 via ORAL
  Filled 2015-06-07: qty 1

## 2015-06-07 MED ORDER — HYDROMORPHONE HCL 2 MG/ML IJ SOLN
2.0000 mg | Freq: Once | INTRAMUSCULAR | Status: AC
  Administered 2015-06-07: 2 mg via INTRAMUSCULAR
  Filled 2015-06-07: qty 1

## 2015-06-07 MED ORDER — HYDROCODONE-ACETAMINOPHEN 5-325 MG PO TABS: 1.0000 | ORAL_TABLET | Freq: Four times a day (QID) | ORAL | Status: DC | PRN

## 2015-06-07 MED ORDER — HYDROMORPHONE HCL 1 MG/ML IJ SOLN
INTRAMUSCULAR | Status: AC
  Filled 2015-06-07: qty 2

## 2015-06-07 MED ORDER — NAPROXEN 500 MG PO TABS: 500.0000 mg | ORAL_TABLET | Freq: Two times a day (BID) | ORAL | Status: DC

## 2015-06-07 NOTE — ED Notes (Signed)
Left sided lower back pain x 2 weeks constant in nature reports left hip pain associated with it. Denies N/V fever urinary sx, numbness in extremities.

## 2015-06-07 NOTE — ED Provider Notes (Signed)
CSN: [id number]     Arrival date & time 06/07/15  1928 History  By signing my name below, I, Ronney Lion, attest that this documentation has been prepared under the direction and in the presence of Vanetta Mulders, MD. Electronically Signed: Ronney Lion, ED Scribe. 06/07/2015. 9:39 PM.   Chief Complaint  Patient presents with  . Back Pain   Patient is a 24 y.o. female presenting with hip pain. The history is provided by the patient. No language interpreter was used.  Hip Pain This is a recurrent problem. The current episode started more than 1 week ago. The problem occurs constantly. The problem has been gradually worsening. Pertinent negatives include no chest pain, no abdominal pain, no headaches and no shortness of breath. Nothing aggravates the symptoms. Nothing relieves the symptoms. She has tried nothing for the symptoms.    HPI Comments: Kayla Richmond is a 24 y.o. female who presents to the Emergency Department complaining of left hip pain radiating to her mid low back that began several months but resolved for a long period of time and did not bother her again until 2 weeks ago. 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 Patient had an MRI at that time, which was negative. The pain gradually improved with time until it began again 2 weeks ago. She rates her pain currently in the room as 8/10. Walking exacerbates her pain. She denies any pain in her right hip.  She endorses having nasal congestion. She denies extremity numbness or weakness, fevers, chills, visual changes, cough, sore throat, chest pain, SOB, abdominal pain, nausea, vomiting, diarrhea, dysuria, leg swelling, bleeding easily, rash, or headache.     Past Medical History  Diagnosis Date  . Asthma    History reviewed. No pertinent past surgical history. No family history on file. Social History  Substance Use Topics  . Smoking status: Current  Every Day Smoker -- 0.50 packs/day    Types: Cigarettes  . Smokeless tobacco: None  . Alcohol Use: Yes     Comment: occasional    OB History    Gravida Para Term Preterm AB TAB SAB Ectopic Multiple Living       Review of Systems  Constitutional: Negative for fever and chills.  HENT: Positive for congestion. Negative for sore throat.   Eyes: Negative for visual disturbance.  Respiratory: Negative for cough and shortness of breath.   Cardiovascular: Negative for chest pain and leg swelling.  Gastrointestinal: Negative for nausea, vomiting, abdominal pain and diarrhea.  Genitourinary: Negative for dysuria.  Musculoskeletal: Positive for back pain and arthralgias (left hip pain).  Skin: Negative for rash.  Neurological: Negative for headaches.  Hematological: Does not bruise/bleed easily.    Allergies  Review of patient's allergies indicates no known allergies.  Home Medications   Prior to Admission medications   Medication Sig Start Date End Date Taking? Authorizing Provider  HYDROcodone-acetaminophen (NORCO/VICODIN) 5-325 MG tablet Take 1-2 tablets by mouth every 6 (six) hours as needed for moderate pain. 06/07/15   Vanetta Mulders, MD  naproxen (NAPROSYN) 500 MG tablet Take 1 tablet (500 mg total) by mouth 2 (two) times daily. 06/07/15   Vanetta Mulders, MD  ondansetron (ZOFRAN ODT) 4 MG disintegrating tablet Take 1 tablet (4 mg total) by mouth every 8 (eight) hours as needed for nausea or vomiting. 04/18/15   Layla Maw Ward, DO  promethazine (PHENERGAN) 25 MG tablet Take 1 tablet (25 mg total) by mouth every 6 (six) hours as needed for nausea or vomiting. 04/18/15   Kristen N Ward, DO   BP 143/99 mmHg  Pulse 67  Temp(Src) 98 F (36.7 C) (Oral)  Resp 20  Ht  (1.753 m)  Wt 203 lb (92.08 kg)  BMI 29.96 kg/m2  SpO2 99%  LMP 05/15/2015 Physical Exam  Constitutional: She is oriented to person, place, and time. She appears well-developed and  well-nourished. No distress.  HENT:  Head: Normocephalic and atraumatic.  Mouth/Throat: Oropharynx is clear and moist.  Mucous membranes are moist.   Eyes: Conjunctivae and EOM are normal. Pupils are equal, round, and reactive to light.  Sclera is clear. Pupils are normal. Eyes track normal.   Neck: Neck supple. No tracheal deviation present.  Cardiovascular: Normal rate, regular rhythm and normal heart sounds.   No murmur heard. Pulmonary/Chest: Effort normal and breath sounds normal. No respiratory distress. She has no wheezes. She has no rales.  Lungs are clear to auscultation bilaterally.   Abdominal: Soft. Bowel sounds are normal. There is no tenderness.  Musculoskeletal: Normal range of motion. She exhibits tenderness. She exhibits no edema.  TTP over left lumbar area and left hip.   Neurological: She is alert and oriented to person, place, and time.  Skin: Skin is warm and dry.  Psychiatric: She has a normal mood and affect. Her behavior is normal.  Nursing note and vitals reviewed.   ED Course  Procedures (including critical care time)  DIAGNOSTIC STUDIES: Oxygen Saturation is 99% on RA, normal by my interpretation.    COORDINATION OF CARE: 9:21 PM - Discussed treatment plan with pt at bedside which includes XR of back and hip. Pt verbalized understanding and agreed to plan.   Labs Review Labs Reviewed  URINALYSIS, ROUTINE W REFLEX MICROSCOPIC (NOT AT [facility])  PREGNANCY, URINE   Imaging Review Dg Lumbar Spine Complete  06/07/2015  CLINICAL DATA:  Mid lumbosacral back pain for several months. Pain in both hips radiating to the low back. EXAM: LUMBAR SPINE - COMPLETE 4+ VIEW COMPARISON:  None. FINDINGS: The alignment is maintained. Vertebral body heights are normal. There is no listhesis. The posterior elements are intact. Disc spaces are preserved. No fracture. Sacroiliac joints are symmetric and normal. IMPRESSION: Negative radiographs of the lumbar spine. Electronically  Signed   By: Rubye Oaks M.D.   On: 06/07/2015 22:31   Dg Hips Bilat With Pelvis 3-4 Views  06/07/2015  CLINICAL DATA:  Bilateral hip pain for months. Pain radiates to the low back. EXAM: DG HIP (WITH OR WITHOUT PELVIS) 3-4V BILAT COMPARISON:  None. FINDINGS: The cortical margins of the bony pelvis and both hips are intact. Hip joint spaces are normal. No fracture. Pubic symphysis and sacroiliac joints are congruent. Both femoral heads are well-seated in the respective acetabula. IMPRESSION: Negative radiographs of the pelvis and both hips. Electronically Signed   By: Rubye Oaks M.D.   On: 06/07/2015 22:32   I have personally reviewed and evaluated these images and lab results as part of my medical decision-making.   MDM   Final diagnoses:  Hip pain, left   Patient with complaint of left hip pain for 2 weeks associated with some low back pain. 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 That was 2 years ago. Today's x-rays of bilateral hips pelvis and low back without any bony  injuries. Patient without any neuro focal deficits. Will treat with anti-inflammatories and pain medicine and referral to sports medicine. No history of injury.  I, Meryn Sarracino, personally performed the services described in this documentation. All medical record entries made by the scribe were at my direction and in my presence.  I have reviewed the chart and discharge instructions and agree that the record reflects my personal performance and is accurate and complete. Carrin Vannostrand.  06/07/2015. 11:20 PM.       Vanetta MuldersScott Annaclaire Walsworth, MD 06/07/15 2321

## 2015-06-07 NOTE — Discharge Instructions (Signed)
X-rays of the low back and hips and pelvis without any bony abnormalities. Take the Naprosyn on a regular basis. Supplemented with the hydrocodone as needed for additional pain control. Make an appointment to follow-up with sports medicine.

## 2015-06-16 ENCOUNTER — Emergency Department (HOSPITAL_BASED_OUTPATIENT_CLINIC_OR_DEPARTMENT_OTHER)
Admission: EM | Admit: 2015-06-16 | Discharge: 2015-06-16 | Disposition: A | Discharge status: Home / Self Care | Payer: BC Managed Care – PPO | Attending: Emergency Medicine | Admitting: Emergency Medicine

## 2015-06-16 ENCOUNTER — Encounter (HOSPITAL_BASED_OUTPATIENT_CLINIC_OR_DEPARTMENT_OTHER): Payer: Self-pay

## 2015-06-16 DIAGNOSIS — M25552 Pain in left hip: Secondary | ICD-10-CM | POA: Insufficient documentation

## 2015-06-16 DIAGNOSIS — R112 Nausea with vomiting, unspecified: Secondary | ICD-10-CM | POA: Insufficient documentation

## 2015-06-16 DIAGNOSIS — Z791 Long term (current) use of non-steroidal anti-inflammatories (NSAID): Secondary | ICD-10-CM | POA: Insufficient documentation

## 2015-06-16 DIAGNOSIS — M545 Low back pain: Secondary | ICD-10-CM | POA: Insufficient documentation

## 2015-06-16 DIAGNOSIS — J45909 Unspecified asthma, uncomplicated: Secondary | ICD-10-CM | POA: Insufficient documentation

## 2015-06-16 DIAGNOSIS — M546 Pain in thoracic spine: Secondary | ICD-10-CM | POA: Insufficient documentation

## 2015-06-16 DIAGNOSIS — M549 Dorsalgia, unspecified: Secondary | ICD-10-CM

## 2015-06-16 DIAGNOSIS — Z79899 Other long term (current) drug therapy: Secondary | ICD-10-CM | POA: Insufficient documentation

## 2015-06-16 DIAGNOSIS — Z720 Tobacco use: Secondary | ICD-10-CM | POA: Insufficient documentation

## 2015-06-16 MED ORDER — OXYCODONE-ACETAMINOPHEN 5-325 MG PO TABS
2.0000 | ORAL_TABLET | Freq: Once | ORAL | Status: AC
  Administered 2015-06-16: 2 via ORAL
  Filled 2015-06-16: qty 2

## 2015-06-16 MED ORDER — ONDANSETRON 4 MG PO TBDP
4.0000 mg | ORAL_TABLET | Freq: Once | ORAL | Status: AC
  Administered 2015-06-16: 4 mg via ORAL
  Filled 2015-06-16: qty 1

## 2015-06-16 MED ORDER — METHOCARBAMOL 500 MG PO TABS
500.0000 mg | ORAL_TABLET | Freq: Once | ORAL | Status: AC
  Administered 2015-06-16: 500 mg via ORAL
  Filled 2015-06-16: qty 1

## 2015-06-16 MED ORDER — IBUPROFEN 800 MG PO TABS
800.0000 mg | ORAL_TABLET | Freq: Once | ORAL | Status: AC
  Administered 2015-06-16: 800 mg via ORAL
  Filled 2015-06-16: qty 1

## 2015-06-16 MED ORDER — METHOCARBAMOL 500 MG PO TABS: 500.0000 mg | ORAL_TABLET | Freq: Two times a day (BID) | ORAL | Status: DC

## 2015-06-16 MED ORDER — IBUPROFEN 800 MG PO TABS: 800.0000 mg | ORAL_TABLET | Freq: Three times a day (TID) | ORAL | Status: AC

## 2015-06-16 NOTE — ED Provider Notes (Signed)
CSN: [id number]     Arrival date & time 06/16/15  1155 History   First MD Initiated Contact with Patient 06/16/15 1220     Chief Complaint  Patient presents with  . Hip Pain    HPI   Kayla Richmond is a 24 y.o. female with a PMH of asthma who presents to the ED with left back and left hip pain. She reports her pain has been intermittent over the last year. She states last night, she had difficulty sleeping due to pain. She reports this morning when she woke up, she felt nauseous and had several episodes of emesis. She denies hematemesis. She states movement exacerbates her pain. She has tried vicodin for symptom relief, which was minimally effective. She denies fever, chills, chest pain, shortness of breath, abdominal pain, diarrhea, constipation, urinary frequency or urgency, dysuria, numbness, paresthesia, weakness, bowel or bladder incontinence, saddle anesthesia, history of malignancy, history of IV drug use, anticoagulant use.   Past Medical History  Diagnosis Date  . Asthma    History reviewed. No pertinent past surgical history. No family history on file. Social History  Substance Use Topics  . Smoking status: Current Every Day Smoker -- 0.50 packs/day    Types: Cigarettes  . Smokeless tobacco: None  . Alcohol Use: Yes     Comment: occasional    OB History    Gravida Para Term Preterm AB TAB SAB Ectopic Multiple Living   0 0 0 0 0 0 0 0 0 0       Review of Systems  Constitutional: Negative for fever and chills.  Respiratory: Negative for shortness of breath.   Cardiovascular: Negative for chest pain.  Gastrointestinal: Positive for nausea and vomiting. Negative for abdominal pain, diarrhea and constipation.  Genitourinary: Negative for dysuria, urgency and frequency.  Musculoskeletal: Positive for back pain and arthralgias.  Neurological: Negative for dizziness, weakness, light-headedness, numbness and headaches.  All other systems reviewed and are  negative.     Allergies  Review of patient's allergies indicates no known allergies.  Home Medications   Prior to Admission medications   Medication Sig Start Date End Date Taking? Authorizing Provider  HYDROcodone-acetaminophen (NORCO/VICODIN) 5-325 MG tablet Take 1-2 tablets by mouth every 6 (six) hours as needed for moderate pain. 06/07/15   Vanetta MuldersScott Zackowski, MD  ibuprofen (ADVIL,MOTRIN) 800 MG tablet Take 1 tablet (800 mg total) by mouth 3 (three) times daily. 06/16/15   Mady GemmaElizabeth C Umaima Scholten, PA-C  methocarbamol (ROBAXIN) 500 MG tablet Take 1 tablet (500 mg total) by mouth 2 (two) times daily. 06/16/15   Mady GemmaElizabeth C Valeen Borys, PA-C  naproxen (NAPROSYN) 500 MG tablet Take 1 tablet (500 mg total) by mouth 2 (two) times daily. 06/07/15   Vanetta MuldersScott Zackowski, MD    BP 144/83 mmHg  Pulse 75  Temp(Src) 98.2 F (36.8 C) (Oral)  Resp 16  Ht 5\' 9"  (1.753 m)  Wt 203 lb (92.08 kg)  BMI 29.96 kg/m2  SpO2 100%  LMP 05/15/2015 Physical Exam  Constitutional: She is oriented to person, place, and time. She appears well-developed and well-nourished. No distress.  HENT:  Head: Normocephalic and atraumatic.  Right Ear: External ear normal.  Left Ear: External ear normal.  Nose: Nose normal.  Mouth/Throat: Uvula is midline, oropharynx is clear and moist and mucous membranes are normal.  Eyes: Conjunctivae, EOM and lids are normal. Pupils are equal, round, and reactive to light. Right eye exhibits no discharge. Left eye exhibits no discharge. No scleral icterus.  Neck: Normal  range of motion. Neck supple.  Cardiovascular: Normal rate, regular rhythm, normal heart sounds, intact distal pulses and normal pulses.   Pulmonary/Chest: Effort normal and breath sounds normal. No respiratory distress.  Abdominal: Soft. Normal appearance and bowel sounds are normal. She exhibits no distension and no mass. There is no tenderness. There is no rigidity, no rebound and no guarding.  Musculoskeletal: Normal range  of motion. She exhibits tenderness. She exhibits no edema.  Tenderness to palpation of thoracic and lumbar paraspinal muscles with palpable spasm. No midline tenderness, step-off, or deformity. Tenderness to palpation of left lateral hip. Patient moves all extremities without difficulty. Strength and sensation intact. Distal pulses intact.  Neurological: She is alert and oriented to person, place, and time. She has normal strength. No sensory deficit.  Skin: Skin is warm, dry and intact. No rash noted. She is not diaphoretic. No erythema. No pallor.  Psychiatric: She has a normal mood and affect. Her speech is normal and behavior is normal.  Nursing note and vitals reviewed.   ED Course  Procedures (including critical care time)  Labs Review Labs Reviewed - No data to display  Imaging Review No results found.    EKG Interpretation None      MDM   Final diagnoses:  Back pain, unspecified location  Left hip pain    24 year old female presents with left back and left hip pain, which she states has been present intermittently over the last year after straining her back. Denies fever, chills, chest pain, shortness of breath, abdominal pain, diarrhea, constipation, urinary frequency or urgency, dysuria, numbness, paresthesia, weakness, bowel or bladder incontinence, saddle anesthesia, history of malignancy, history of IV drug use, anticoagulant use. Patient was evaluated in the ED 10/22 for the same symptoms. Imaging of lumbar spine, pelvis, and hips unremarkable at that time.  Patient is afebrile. Vital signs stable. Heart regular rate and rhythm. Lungs clear to auscultation bilaterally. Abdomen soft, nontender, nondistended. Tenderness to palpation of thoracic and lumbar paraspinal muscles with palpable spasm. No midline tenderness, step-off, or deformity. Tenderness to palpation of left lateral hip. Patient moves all extremities without difficulty. Strength and sensation intact. Distal  pulses intact.  Patient underwent imaging 10/22, do not feel further imaging is indicated at this time given no change in the character of her symptoms and no recent injury. Will treat pain with robaxin and ibuprofen. Patient to follow up with orthopedics for further evaluation and management. Return precautions discussed. Patient verbalizes her understanding and is in agreement with plan.  BP 144/83 mmHg  Pulse 75  Temp(Src) 98.2 F (36.8 C) (Oral)  Resp 16  Ht  (1.753 m)  Wt 203 lb (92.08 kg)  BMI 29.96 kg/m2  SpO2 100%  LMP 05/15/2015     Mady Gemma, PA-C 06/16/15 1650  Arby Barrette, MD 06/17/15 [phone]

## 2015-06-16 NOTE — Discharge Instructions (Signed)
1. Medications: ibuprofen, robaxin, usual home medications 2. Treatment: rest, drink plenty of fluids, back exercises, ice, heat 3. Follow Up: please followup with orthopedics for discussion of your diagnoses and further evaluation after today's visit; please return to the ER for severe pain, high fever, numbness, weakness, new or worsening symptoms   Back Exercises The following exercises strengthen the muscles that help to support the back. They also help to keep the lower back flexible. Doing these exercises can help to prevent back pain or lessen existing pain. If you have back pain or discomfort, try doing these exercises 2-3 times each day or as told by your health care provider. When the pain goes away, do them once each day, but increase the number of times that you repeat the steps for each exercise (do more repetitions). If you do not have back pain or discomfort, do these exercises once each day or as told by your health care provider. EXERCISES Single Knee to Chest Repeat these steps 3-5 times for each leg:  Lie on your back on a firm bed or the floor with your legs extended.  Bring one knee to your chest. Your other leg should stay extended and in contact with the floor.  Hold your knee in place by grabbing your knee or thigh.  Pull on your knee until you feel a gentle stretch in your lower back.  Hold the stretch for 10-30 seconds.  Slowly release and straighten your leg. Pelvic Tilt Repeat these steps 5-10 times:  Lie on your back on a firm bed or the floor with your legs extended.  Bend your knees so they are pointing toward the ceiling and your feet are flat on the floor.  Tighten your lower abdominal muscles to press your lower back against the floor. This motion will tilt your pelvis so your tailbone points up toward the ceiling instead of pointing to your feet or the floor.  With gentle tension and even breathing, hold this position for 5-10  seconds. Cat-Cow Repeat these steps until your lower back becomes more flexible:  Get into a hands-and-knees position on a firm surface. Keep your hands under your shoulders, and keep your knees under your hips. You may place padding under your knees for comfort.  Let your head hang down, and point your tailbone toward the floor so your lower back becomes rounded like the back of a cat.  Hold this position for 5 seconds.  Slowly lift your head and point your tailbone up toward the ceiling so your back forms a sagging arch like the back of a cow.  Hold this position for 5 seconds. Press-Ups Repeat these steps 5-10 times:  Lie on your abdomen (face-down) on the floor.  Place your palms near your head, about shoulder-width apart.  While you keep your back as relaxed as possible and keep your hips on the floor, slowly straighten your arms to raise the top half of your body and lift your shoulders. Do not use your back muscles to raise your upper torso. You may adjust the placement of your hands to make yourself more comfortable.  Hold this position for 5 seconds while you keep your back relaxed.  Slowly return to lying flat on the floor. Bridges Repeat these steps 10 times: 1. Lie on your back on a firm surface. 2. Bend your knees so they are pointing toward the ceiling and your feet are flat on the floor. 3. Tighten your buttocks muscles and lift your buttocks  off of the floor until your waist is at almost the same height as your knees. You should feel the muscles working in your buttocks and the back of your thighs. If you do not feel these muscles, slide your feet 1-2 inches farther away from your buttocks. 4. Hold this position for 3-5 seconds. 5. Slowly lower your hips to the starting position, and allow your buttocks muscles to relax completely. If this exercise is too easy, try doing it with your arms crossed over your chest. Abdominal Crunches Repeat these steps 5-10  times: 1. Lie on your back on a firm bed or the floor with your legs extended. 2. Bend your knees so they are pointing toward the ceiling and your feet are flat on the floor. 3. Cross your arms over your chest. 4. Tip your chin slightly toward your chest without bending your neck. 5. Tighten your abdominal muscles and slowly raise your trunk (torso) high enough to lift your shoulder blades a tiny bit off of the floor. Avoid raising your torso higher than that, because it can put too much stress on your low back and it does not help to strengthen your abdominal muscles. 6. Slowly return to your starting position. Back Lifts Repeat these steps 5-10 times: 1. Lie on your abdomen (face-down) with your arms at your sides, and rest your forehead on the floor. 2. Tighten the muscles in your legs and your buttocks. 3. Slowly lift your chest off of the floor while you keep your hips pressed to the floor. Keep the back of your head in line with the curve in your back. Your eyes should be looking at the floor. 4. Hold this position for 3-5 seconds. 5. Slowly return to your starting position. SEEK MEDICAL CARE IF:  Your back pain or discomfort gets much worse when you do an exercise.  Your back pain or discomfort does not lessen within 2 hours after you exercise. If you have any of these problems, stop doing these exercises right away. Do not do them again unless your health care provider says that you can. SEEK IMMEDIATE MEDICAL CARE IF:  You develop sudden, severe back pain. If this happens, stop doing the exercises right away. Do not do them again unless your health care provider says that you can.   This information is not intended to replace advice given to you by your health care provider. Make sure you discuss any questions you have with your health care provider.   Document Released: 09/09/2004 Document Revised: 04/23/2015 Document Reviewed: 09/26/2014 Elsevier Interactive Patient Education  2016 Elsevier Inc.  Back Pain, Adult Back pain is very common in adults.The cause of back pain is rarely dangerous and the pain often gets better over time.The cause of your back pain may not be known. Some common causes of back pain include:  Strain of the muscles or ligaments supporting the spine.  Wear and tear (degeneration) of the spinal disks.  Arthritis.  Direct injury to the back. For many people, back pain may return. Since back pain is rarely dangerous, most people can learn to manage this condition on their own. HOME CARE INSTRUCTIONS Watch your back pain for any changes. The following actions may help to lessen any discomfort you are feeling:  Remain active. It is stressful on your back to sit or stand in one place for long periods of time. Do not sit, drive, or stand in one place for more than 30 minutes at a time. Take short  walks on even surfaces as soon as you are able.Try to increase the length of time you walk each day.  Exercise regularly as directed by your health care provider. Exercise helps your back heal faster. It also helps avoid future injury by keeping your muscles strong and flexible.  Do not stay in bed.Resting more than 1-2 days can delay your recovery.  Pay attention to your body when you bend and lift. The most comfortable positions are those that put less stress on your recovering back. Always use proper lifting techniques, including:  Bending your knees.  Keeping the load close to your body.  Avoiding twisting.  Find a comfortable position to sleep. Use a firm mattress and lie on your side with your knees slightly bent. If you lie on your back, put a pillow under your knees.  Avoid feeling anxious or stressed.Stress increases muscle tension and can worsen back pain.It is important to recognize when you are anxious or stressed and learn ways to manage it, such as with exercise.  Take medicines only as directed by your health care provider.  Over-the-counter medicines to reduce pain and inflammation are often the most helpful.Your health care provider may prescribe muscle relaxant drugs.These medicines help dull your pain so you can more quickly return to your normal activities and healthy exercise.  Apply ice to the injured area:  Put ice in a plastic bag.  Place a towel between your skin and the bag.  Leave the ice on for 20 minutes, 2-3 times a day for the first 2-3 days. After that, ice and heat may be alternated to reduce pain and spasms.  Maintain a healthy weight. Excess weight puts extra stress on your back and makes it difficult to maintain good posture. SEEK MEDICAL CARE IF:  You have pain that is not relieved with rest or medicine.  You have increasing pain going down into the legs or buttocks.  You have pain that does not improve in one week.  You have night pain.  You lose weight.  You have a fever or chills. SEEK IMMEDIATE MEDICAL CARE IF:   You develop new bowel or bladder control problems.  You have unusual weakness or numbness in your arms or legs.  You develop nausea or vomiting.  You develop abdominal pain.  You feel faint.   This information is not intended to replace advice given to you by your health care provider. Make sure you discuss any questions you have with your health care provider.   Document Released: 08/02/2005 Document Revised: 08/23/2014 Document Reviewed: 12/04/2013 Elsevier Interactive Patient Education 2016 [employer]   Emergency Department Resource Guide 1) Find a Doctor and Pay Out of Pocket Although you won't have to find out who is covered by your insurance plan, it is a good idea to ask around and get recommendations. You will then need to call the office and see if the doctor you have chosen will accept you as a new patient and what types of options they offer for patients who are self-pay. Some doctors offer discounts or will set up payment plans for their  patients who do not have insurance, but you will need to ask so you aren't surprised when you get to your appointment.  2) Contact Your Local Health Department Not all health departments have doctors that can see patients for sick visits, but many do, so it is worth a call to see if yours does. If you don't know where your local health department  is, you can check in your phone book. The CDC also has a tool to help you locate your state's health department, and many state websites also have listings of all of their local health departments.  3) Find a Walk-in Clinic If your illness is not likely to be very severe or complicated, you may want to try a walk in clinic. These are popping up all over the country in pharmacies, drugstores, and shopping centers. They're usually staffed by nurse practitioners or physician assistants that have been trained to treat common illnesses and complaints. They're usually fairly quick and inexpensive. However, if you have serious medical issues or chronic medical problems, these are probably not your best option.  No Primary Care Doctor: - Call Health Connect at  [phone] - they can help you locate a primary care doctor that  accepts your insurance, provides certain services, etc. - Physician Referral Service- [phone]  Chronic Pain Problems: Organization         Address  Phone   Notes  [facility]  [phone] Patients need to be referred by their primary care doctor.   Medication Assistance: Organization         Address  Phone   Notes  [facility] Medication [facility] [address] [place], [place] [address] [phone] --Must be a resident of [facility] -- Must have NO insurance coverage whatsoever (no Medicaid/ Medicare, etc.) -- The pt. MUST have a primary care doctor that directs their care regularly and follows them in the community   [facility]  [phone]   [employer]  [phone]     Agencies that provide inexpensive medical care: Organization         Address  Phone   Notes  [facility]  [phone]   [facility]    [phone]   [facility] [address] [place], [place] [address] [phone]   [facility] [address], [place] [phone]   Planned Parenthood    [phone]   [facility]    [phone]   [facility]  [address], [place] Phone:  [phone], Fax:  [phone] Hours of Operation:  9 am - 6 pm, M-F.  Also accepts Medicaid/Medicare and self-pay.  [facility]  [address], [place] Phone: [phone], Fax: [phone]. Hours of Operation:  8:30 am - 5:30 pm, M-F.  Also accepts Medicaid and self-pay.  [facility] [place] [address] [place] Phone: [phone]   [facility] [address] Natasha Bence [place], [place] [phone], Ext. [phone] Mondays & Thursdays: 7-9 AM.  First 15 patients are seen on a first come, first serve basis.    Medicaid-accepting [facility] Providers:  Organization         Address  Phone   Notes  [facility] [address],  [phone] Also accepts self-pay patients.  [facility] [address] Laurell Josephs [place], [place]  [phone]   [facility] [address], [place] [phone]   [facility] [address] [place] [phone]   Renaye Rakers [address], [place]   [phone] Only accepts [place] Access [place] patients after they have their name  applied to their card.   Self-Pay (no insurance) in [facility]:  Organization         Address  Phone   Notes  Sickle Cell Patients, [facility] [address] [place], [place] [phone]    [facility] [address] [place], [place] [phone]   [facility]  [address], [place] [phone]   [facility]/Dr. Osei-Bonsu  [address], [place] or [address], [place] [phone] Phone number for both [place] and [place] locations is the same.  [facility] [address] [place] [phone]   [facility] [address], [place] or [address] [phone]   [facility] [address], [place] [phone], phone; [phone], fax Sees patients 1st and 3rd Saturday of every month.  Must not qualify for public or private insurance (i.e. Medicaid, Medicare, [place] Health Choice, Veterans' Benefits)  Household income should be no more than 200% of the poverty level The clinic cannot treat you if you are pregnant or think you are pregnant  Sexually transmitted diseases are not treated at the clinic.    Dental Care: Organization         Address  Phone  Notes  [facility] Department of [facility] [address] [place], [place] [phone] Accepts children up to age 9 who are enrolled in [place] or [place] Health Choice; pregnant women with a Medicaid card; and children who have applied for Medicaid or [place] Health Choice, but were declined, whose parents can pay a reduced fee at time of service.  [facility] of [facility]  [address], [place] [phone] Accepts children up to age 26 who are enrolled in [place] or [place] Health Choice; pregnant women with a Medicaid card; and children who have applied for Medicaid or [place] Health Choice, but were declined, whose parents can pay a reduced fee at time of service.  [facility]  [address] [place], [place] [phone] Patients are seen by  appointment only. Walk-ins are not accepted. [facility] will see patients 38 years of age and older. Monday - Tuesday (8am-5pm) Most Wednesdays (8:30-5pm) $30 per visit, cash only  [facility] Adult [facility]  [address], [facility] [phone] Patients are seen by appointment only. Walk-ins are not accepted. [facility] will see patients 94 years of age and older. One Wednesday Evening (Monthly: Volunteer Based).  $30 per visit, cash only  [employer]  [phone] for adults; Children under age 51, call Graduate Pediatric Dentistry at [phone]. Children aged 59-14, please call [phone] to request a pediatric application.  Dental services are provided in all areas of dental care including fillings, crowns and bridges, complete and partial dentures, implants, gum treatment, root canals, and extractions. Preventive care is also provided. Treatment is provided to both adults and children. Patients are selected via a lottery and there is often a waiting list.   [facility] [address] [place]  [phone] [email or web address]   [facility] [address] [place], [place] [phone], Ext. [phone] Second and Fourth Thursday of each month, opens at 6:30 AM; Clinic ends at 9 AM.  Patients are seen on a first-come  first-served basis, and a limited number are seen during each clinic.   [facility] [place]  [address] Ether Griffins [place], [place] [phone]   Eligibility Requirements You must have lived in [place], [place], or [place] counties for at least the last three months.   You cannot be eligible for state or federal sponsored [place], including [employer], [place], or [employer].   You generally cannot be eligible for healthcare insurance through your employer.    How to apply: Eligibility screenings are held every Tuesday and Wednesday afternoon from 1:00 pm until 4:00 pm. You  do not need an appointment for the interview!  [facility] [address], [place], [place] [phone]   [facility] Health Department  [phone]   [facility] Health Department  [phone]   [facility] Health Department  [phone]    Behavioral Health Resources in the Community: Intensive Outpatient Programs Organization         Address  Phone  Notes  [facility] [address], [place], [place] [phone]   [facility] Outpatient [address] [place], [place] [phone]   [facility] [address] [place], [place]  [phone]   [facility] Mental Health [address],  [place], [place] [phone] or [phone]   Substance Abuse Resources Organization         Address  Phone  Notes  [facility]  [phone]   [facility]  [phone]   [facility] [place]  [phone]   Floydene Flock  [phone]   Residential & Outpatient Substance Abuse Program  [phone]   Psychological Services Organization         Address  Phone  Notes  [facility] Behavioral Health  [phone]   [facility]  [phone]   [facility] Mental Health [address], [place] [phone] or [phone]    Mobile Crisis Teams Organization         Address  Phone  Notes  [facility], Mobile Crisis Care Unit  [phone]   [facility]  [address] [place], [place] [phone]   Doristine Locks [address] [place] [phone]    Self-Help/Support Groups Organization         Address  Phone             Notes  [facility] [place] - variety of support groups  336- I7437963 Call for more information  Narcotics Anonymous (NA), [facility] [address], [employer] [place]  2 meetings at this location   Statistician          Address  Phone  Notes  [facility] [address],    [place]  [phone]   [facility]  [address] [place] [address], [place], [place] [phone]   [facility] [address] [place], [place] [phone] Admissions: 8am-3pm M-F  [facility] [address],    [place], [place] [phone]   [facility] [address] Starling Manns [place], [place] [phone]   [facility] [address],  [place], [place] [phone]   [facility] - Intensive Outpatient [address]ure[address] Joseph[address], [place], [place] [phone]   [facility] ([facility] [address] [place]  [place], [place] [phone] or [phone]   [facility] ([facility]) [address], [place], [place] [phone] Accepts  Medicaid  [facility] [address],  [place] [phone] Substance Abuse/Addiction Treatment   [facility] Organization         Address  Phone  Notes  [facility]  [phone]   Domenic Schwab, PhD [address] [place], [place]   [phone] or [phone]   [place], [place] [phone]   [address], [place], [place] [phone] Insurance/Medicaid/sponsorship through [facility] and Families [address] [place]                                    [place], [place] [phone] [place] [address], [place] [phone]    Dr. Adele Schilder  [phone]   [facility] Dept. 1) [address] [place] 2) [place] 3)  [place] [address], [place] [phone]  [phone]   [place] [phone] or [phone] (After Hours)

## 2015-06-16 NOTE — ED Notes (Signed)
D/c home with ride- directed to pharmacy to pick up medications

## 2015-06-16 NOTE — ED Notes (Signed)
C/o cont'd left hip pain-seen here for same last week-pt with steady gait to triage

## 2015-10-25 ENCOUNTER — Emergency Department (HOSPITAL_BASED_OUTPATIENT_CLINIC_OR_DEPARTMENT_OTHER): Payer: Self-pay

## 2015-10-25 ENCOUNTER — Encounter (HOSPITAL_BASED_OUTPATIENT_CLINIC_OR_DEPARTMENT_OTHER): Payer: Self-pay | Admitting: Emergency Medicine

## 2015-10-25 DIAGNOSIS — Y9289 Other specified places as the place of occurrence of the external cause: Secondary | ICD-10-CM | POA: Insufficient documentation

## 2015-10-25 DIAGNOSIS — W08XXXA Fall from other furniture, initial encounter: Secondary | ICD-10-CM | POA: Insufficient documentation

## 2015-10-25 DIAGNOSIS — J45909 Unspecified asthma, uncomplicated: Secondary | ICD-10-CM | POA: Insufficient documentation

## 2015-10-25 DIAGNOSIS — S92252A Displaced fracture of navicular [scaphoid] of left foot, initial encounter for closed fracture: Secondary | ICD-10-CM | POA: Insufficient documentation

## 2015-10-25 DIAGNOSIS — Y998 Other external cause status: Secondary | ICD-10-CM | POA: Insufficient documentation

## 2015-10-25 DIAGNOSIS — Y9389 Activity, other specified: Secondary | ICD-10-CM | POA: Insufficient documentation

## 2015-10-25 DIAGNOSIS — Z79899 Other long term (current) drug therapy: Secondary | ICD-10-CM | POA: Insufficient documentation

## 2015-10-25 DIAGNOSIS — F17210 Nicotine dependence, cigarettes, uncomplicated: Secondary | ICD-10-CM | POA: Insufficient documentation

## 2015-10-25 DIAGNOSIS — Z791 Long term (current) use of non-steroidal anti-inflammatories (NSAID): Secondary | ICD-10-CM | POA: Insufficient documentation

## 2015-10-25 NOTE — ED Notes (Signed)
Patient states that she fell off the porch about  30 minutes ago. Patient has pain and swelling to her left ankle

## 2015-10-25 NOTE — ED Notes (Signed)
Ice pack given in triage.

## 2015-10-26 ENCOUNTER — Emergency Department (HOSPITAL_BASED_OUTPATIENT_CLINIC_OR_DEPARTMENT_OTHER)
Admission: EM | Admit: 2015-10-26 | Discharge: 2015-10-26 | Disposition: A | Discharge status: Home / Self Care | Payer: Self-pay | Attending: Emergency Medicine | Admitting: Emergency Medicine

## 2015-10-26 DIAGNOSIS — S92252A Displaced fracture of navicular [scaphoid] of left foot, initial encounter for closed fracture: Secondary | ICD-10-CM

## 2015-10-26 MED ORDER — HYDROCODONE-ACETAMINOPHEN 5-325 MG PO TABS
1.0000 | ORAL_TABLET | Freq: Once | ORAL | Status: AC
  Administered 2015-10-26: 1 via ORAL
  Filled 2015-10-26: qty 1

## 2015-10-26 MED ORDER — HYDROCODONE-ACETAMINOPHEN 5-325 MG PO TABS: 1.0000 | ORAL_TABLET | Freq: Four times a day (QID) | ORAL | Status: DC | PRN

## 2015-10-26 NOTE — ED Notes (Addendum)
Was running out of the house around 2045 tonight, stepped wrong and rolled ankle, c/o pain & swelling, "hurts to move". CMS intact, ROM limited d/t pain.

## 2015-10-26 NOTE — ED Provider Notes (Signed)
CSN: [id number]     Arrival date & time 10/25/15  2241 History   First MD Initiated Contact with Patient 10/26/15 0151     Chief Complaint  Patient presents with  . Ankle Pain     (Consider location/radiation/quality/duration/timing/severity/associated sxs/prior Treatment) HPI  This is a [age] are old female who presents with left foot injury. Patient states that she "is clumsy" and misstepped on her left foot hurting her left foot and ankle. This happened at 8 PM last night. It is more painful with ambulation. Current pain is 8 out of 10. She's not taken anything for the pain. She denies other injury.  Past Medical History  Diagnosis Date  . Asthma    History reviewed. No pertinent past surgical history. History reviewed. No pertinent family history. Social History  Substance Use Topics  . Smoking status: Current Every Day Smoker -- 0.50 packs/day    Types: Cigarettes  . Smokeless tobacco: None  . Alcohol Use: Yes     Comment: occasional    OB History    Gravida Para Term Preterm AB TAB SAB Ectopic Multiple Living   0 0 0 0 0 0 0 0 0 0      Review of Systems  Musculoskeletal:       Left foot pain  Neurological: Negative for numbness.  All other systems reviewed and are negative.     Allergies  Review of patient's allergies indicates no known allergies.  Home Medications   Prior to Admission medications   Medication Sig Start Date End Date Taking? Authorizing Provider  HYDROcodone-acetaminophen (NORCO/VICODIN) 5-325 MG tablet Take 1 tablet by mouth every 6 (six) hours as needed for moderate pain. 10/26/15   Shon Batonourtney F Horton, MD  ibuprofen (ADVIL,MOTRIN) 800 MG tablet Take 1 tablet (800 mg total) by mouth 3 (three) times daily. 06/16/15   Mady GemmaElizabeth C Westfall, PA-C  methocarbamol (ROBAXIN) 500 MG tablet Take 1 tablet (500 mg total) by mouth 2 (two) times daily. 06/16/15   Mady GemmaElizabeth C Westfall, PA-C  naproxen (NAPROSYN) 500 MG tablet Take 1 tablet (500 mg total) by mouth  2 (two) times daily. 06/07/15   Vanetta MuldersScott Zackowski, MD   BP 142/94 mmHg  Pulse 86  Temp(Src) 98.3 F (36.8 C) (Oral)  Resp 18  Ht 5\' 9"  (1.753 m)  Wt 200 lb (90.719 kg)  BMI 29.52 kg/m2  SpO2 100%  LMP 09/17/2015 Physical Exam  Constitutional: She is oriented to person, place, and time. She appears well-developed and well-nourished. No distress.  HENT:  Head: Normocephalic and atraumatic.  Cardiovascular: Normal rate, regular rhythm and normal heart sounds.   Pulmonary/Chest: Effort normal and breath sounds normal. No respiratory distress. She has no wheezes.  Musculoskeletal:  Tenderness to palpation and mild swelling noted over the dorsum of the foot near the navicular bone, no additional midfoot tenderness, 2+ DP pulse, limited range of motion the ankle secondary to pain, normal range of motion of the toes, no proximal fibular head tenderness  Neurological: She is alert and oriented to person, place, and time.  Skin: Skin is warm and dry.  Psychiatric: She has a normal mood and affect.  Nursing note and vitals reviewed.   ED Course  Procedures (including critical care time) Labs Review Labs Reviewed - No data to display  Imaging Review Dg Ankle Complete Left  10/25/2015  CLINICAL DATA:  Fall off porch prior to arrival with left ankle pain. EXAM: LEFT ANKLE COMPLETE - 3+ VIEW COMPARISON:  None. FINDINGS: Small dorsal  avulsion off the navicular. No additional acute fracture. The alignment and ankle mortise are preserved. Mild soft tissue edema. IMPRESSION: Small avulsion fracture off the dorsal navicular. No additional acute fracture. Electronically Signed   By: Rubye Oaks M.D.   On: 10/25/2015 23:53   I have personally reviewed and evaluated these images and lab results as part of my medical decision-making.   EKG Interpretation None      MDM   Final diagnoses:  Avulsion fracture of navicular bone of foot, left, closed, initial encounter    Patient presents with  injury to the left foot. X-ray showed a small avulsion fracture of the dorsal navicular. I have reviewed the imaging and there is no significant displacement. It is less than 20%. Will place patient in a short leg splint and provided crutches and pain management. Follow-up with orthopedics.  After history, exam, and medical workup I feel the patient has been appropriately medically screened and is safe for discharge home. Pertinent diagnoses were discussed with the patient. Patient was given return precautions.     Shon Baton, MD 10/26/15 [phone]

## 2015-10-26 NOTE — Discharge Instructions (Signed)
Tarsal Navicular Fracture A tarsal navicular fracture is a break in the navicular bone in your foot. The navicular bone is at the top of the middle of your foot. It is one of the bones in a group of bones called the tarsal bones. The navicular bone is wedged between other bones. Running and jumping put a lot of pressure on your navicular bone. Tarsal navicular fractures occur most often in athletes.  CAUSES  A tarsal navicular fracture can be caused by:  Severe twisting of your foot.  Something heavy falling on your foot.  Stress on the navicular bone from your foot striking the ground repeatedly (stress fracture). RISK FACTORS You may be at risk for a navicular fracture if you participate in high-impact activities such as:  Track and field.  Football.  Soccer.  Basketball.  Gymnastics.  Ballet dancing. Other risk factors include:  Being a woman with an irregular menstrual cycle.  Having a condition that causes your bones to become thin and brittle (osteoporosis).  Being a smoker.  Starting a new sport without being in good shape.  Wearing athletic shoes that do not fit well. SIGNS AND SYMPTOMS An aching pain at the top of your foot is the most common symptom. The pain may move down into the arch of your foot. The pain will get worse with activity and better with rest. Other symptoms may include:  Swelling on the top of your foot.  Pain when pressing on the top of your foot.  Pain when hopping on your foot. DIAGNOSIS  Your health care provider may suspect a tarsal navicular fracture if you recently injured your foot and have symptoms of a fracture. A physical exam will be done. During this exam, your health care provider may move your foot into different positions to check for pain. If you have pain when the health care provider presses on your navicular bone, then it is very likely that you have a navicular fracture. An X-ray of your foot may be done to help confirm the  diagnosis. Regular X-rays often do not show a stress fracture. You may need to have other imaging studies, such as:  A bone scan.  A CT scan.  An MRI. TREATMENT  Your health care provider will determine the best treatment for you based on the severity of your fracture.   If the broken bone is in good alignment, a cast or splint may be applied. The cast or splint will likely need to be worn for several weeks. While the cast or splint is on, you cannot put weight on your foot. You will need close follow-up with your health care provider to make sure you are healing.  Rarely, if the fracture is severe and the broken bone is out of place, your health care provider will need to align the fracture using a surgical procedure called open reduction and internal fixation (ORIF).  In the ORIF procedure, the fracture site is opened up, and the bone pieces are fixed into place with metal screws or pins.  After surgery, you may need to wear a cast or splint. You will also need close follow-up with your health care provider to make sure you are healing. HOME CARE INSTRUCTIONS   Use crutches as directed by your health care provider. Do not put weight on your injured foot until your health care provider approves.  If you have a plaster or fiberglass cast:   Do not try to scratch the skin under the cast with   sharp or pointed objects.  Check the skin around the cast every day. You may put lotion on any red or sore areas.   Keep your cast dry and clean.  Use a plastic bag to protect your cast or splint from water while bathing. Do not lower your cast or splint into water.  Take medicines only as directed by your health care provider.  Keep all follow-up visits as directed by your health care provider. This is important. SEEK MEDICAL CARE IF:   You have very bad pain, and medicine is not helping.  You have more than a small spot of bleeding from under your cast or splint.  You have drainage,  redness, or swelling at the injury site.  You have a fever.  You notice a bad smell coming from your cast or splint.   Your cast or splint cracks, breaks, or gets wet. SEEK IMMEDIATE MEDICAL CARE IF:   You begin to lose feeling in your foot or toes.  You have swelling in your foot or toes that is increasing.  Your foot or toes feel cold or turn blue.  You develop a rash.  MAKE SURE YOU:  Understand these instructions.  Will watch your condition.  Will get help right away if you are not doing well or get worse.   This information is not intended to replace advice given to you by your health care provider. Make sure you discuss any questions you have with your health care provider.   Document Released: 11/14/2000 Document Revised: 08/23/2014 Document Reviewed: 10/05/2013 Elsevier Interactive Patient Education 2016 Elsevier Inc[employer]  

## 2015-12-23 ENCOUNTER — Encounter (HOSPITAL_BASED_OUTPATIENT_CLINIC_OR_DEPARTMENT_OTHER): Payer: Self-pay | Admitting: *Deleted

## 2015-12-23 ENCOUNTER — Emergency Department (HOSPITAL_BASED_OUTPATIENT_CLINIC_OR_DEPARTMENT_OTHER)
Admission: EM | Admit: 2015-12-23 | Discharge: 2015-12-23 | Disposition: A | Discharge status: Home / Self Care | Payer: BC Managed Care – PPO | Attending: Emergency Medicine | Admitting: Emergency Medicine

## 2015-12-23 ENCOUNTER — Emergency Department (HOSPITAL_BASED_OUTPATIENT_CLINIC_OR_DEPARTMENT_OTHER): Payer: BC Managed Care – PPO

## 2015-12-23 DIAGNOSIS — J45909 Unspecified asthma, uncomplicated: Secondary | ICD-10-CM | POA: Insufficient documentation

## 2015-12-23 DIAGNOSIS — W2209XA Striking against other stationary object, initial encounter: Secondary | ICD-10-CM | POA: Insufficient documentation

## 2015-12-23 DIAGNOSIS — Y939 Activity, unspecified: Secondary | ICD-10-CM | POA: Insufficient documentation

## 2015-12-23 DIAGNOSIS — Y929 Unspecified place or not applicable: Secondary | ICD-10-CM | POA: Insufficient documentation

## 2015-12-23 DIAGNOSIS — S51822A Laceration with foreign body of left forearm, initial encounter: Secondary | ICD-10-CM | POA: Insufficient documentation

## 2015-12-23 DIAGNOSIS — S51812A Laceration without foreign body of left forearm, initial encounter: Secondary | ICD-10-CM

## 2015-12-23 DIAGNOSIS — Y999 Unspecified external cause status: Secondary | ICD-10-CM | POA: Insufficient documentation

## 2015-12-23 DIAGNOSIS — F17210 Nicotine dependence, cigarettes, uncomplicated: Secondary | ICD-10-CM | POA: Insufficient documentation

## 2015-12-23 DIAGNOSIS — Z23 Encounter for immunization: Secondary | ICD-10-CM | POA: Insufficient documentation

## 2015-12-23 MED ORDER — TETANUS-DIPHTH-ACELL PERTUSSIS 5-2.5-18.5 LF-MCG/0.5 IM SUSP
0.5000 mL | Freq: Once | INTRAMUSCULAR | Status: AC
  Administered 2015-12-23: 0.5 mL via INTRAMUSCULAR
  Filled 2015-12-23: qty 0.5

## 2015-12-23 MED ORDER — LIDOCAINE-EPINEPHRINE (PF) 2 %-1:200000 IJ SOLN
10.0000 mL | Freq: Once | INTRAMUSCULAR | Status: AC
  Administered 2015-12-23: 10 mL
  Filled 2015-12-23: qty 20

## 2015-12-23 MED ORDER — FENTANYL CITRATE (PF) 100 MCG/2ML IJ SOLN
50.0000 ug | Freq: Once | INTRAMUSCULAR | Status: AC
  Administered 2015-12-23: 50 ug via INTRAVENOUS
  Filled 2015-12-23: qty 2

## 2015-12-23 MED ORDER — IBUPROFEN 400 MG PO TABS: 600.0000 mg | ORAL_TABLET | Freq: Once | ORAL | Status: DC

## 2015-12-23 MED ORDER — TETANUS-DIPHTH-ACELL PERTUSSIS 5-2.5-18.5 LF-MCG/0.5 IM SUSP
INTRAMUSCULAR | Status: AC
  Filled 2015-12-23: qty 0.5

## 2015-12-23 MED ORDER — ONDANSETRON HCL 4 MG/2ML IJ SOLN
4.0000 mg | Freq: Once | INTRAMUSCULAR | Status: AC
  Administered 2015-12-23: 4 mg via INTRAVENOUS
  Filled 2015-12-23: qty 2

## 2015-12-23 NOTE — Discharge Instructions (Signed)
For the first 24 hours, keep your wound covered. After 24 hours he may wash with warm soapy water, dry, and apply bacitracin or other antibiotic ointment. Apply clean dressing. Do this daily until your sutures are removed. 12 sutures were placed on the outside and 3 were placed inside. The inside sutures will resolve. The outside sutures will need to be removed in 10-14 days. As we mentioned, we cannot guarantee that there are no retained foreign bodies and you may see in the future on an x-ray. It is recommended that you use sunscreen over the wound once or sutures are removed or covered with clothing the summer to improve scarring. Please be on the look out for signs of infection, including fever, increasing pain, swelling, redness, pus or drainage from the area, or streaking up the arm. Please see your doctor or return to emergency department if you experience any of the symptoms.    Laceration Care, Adult A laceration is a cut that goes through all of the layers of the skin and into the tissue that is right under the skin. Some lacerations heal on their own. Others need to be closed with stitches (sutures), staples, skin adhesive strips, or skin glue. Proper laceration care minimizes the risk of infection and helps the laceration to heal better. HOW TO CARE FOR YOUR LACERATION If sutures or staples were used:  Keep the wound clean and dry.  If you were given a bandage (dressing), you should change it at least one time per day or as told by your health care provider. You should also change it if it becomes wet or dirty.  Keep the wound completely dry for the first 24 hours or as told by your health care provider. After that time, you may shower or bathe. However, make sure that the wound is not soaked in water until after the sutures or staples have been removed.  Clean the wound one time each day or as told by your health care provider:  Wash the wound with soap and water.  Rinse the wound with  water to remove all soap.  Pat the wound dry with a clean towel. Do not rub the wound.  After cleaning the wound, apply a thin layer of antibiotic ointmentas told by your health care provider. This will help to prevent infection and keep the dressing from sticking to the wound.  Have the sutures or staples removed as told by your health care provider. If skin adhesive strips were used:  Keep the wound clean and dry.  If you were given a bandage (dressing), you should change it at least one time per day or as told by your health care provider. You should also change it if it becomes dirty or wet.  Do not get the skin adhesive strips wet. You may shower or bathe, but be careful to keep the wound dry.  If the wound gets wet, pat it dry with a clean towel. Do not rub the wound.  Skin adhesive strips fall off on their own. You may trim the strips as the wound heals. Do not remove skin adhesive strips that are still stuck to the wound. They will fall off in time. If skin glue was used:  Try to keep the wound dry, but you may briefly wet it in the shower or bath. Do not soak the wound in water, such as by swimming.  After you have showered or bathed, gently pat the wound dry with a clean towel. Do  not rub the wound.  Do not do any activities that will make you sweat heavily until the skin glue has fallen off on its own.  Do not apply liquid, cream, or ointment medicine to the wound while the skin glue is in place. Using those may loosen the film before the wound has healed.  If you were given a bandage (dressing), you should change it at least one time per day or as told by your health care provider. You should also change it if it becomes dirty or wet.  If a dressing is placed over the wound, be careful not to apply tape directly over the skin glue. Doing that may cause the glue to be pulled off before the wound has healed.  Do not pick at the glue. The skin glue usually remains in place  for 5-10 days, then it falls off of the skin. General Instructions  Take over-the-counter and prescription medicines only as told by your health care provider.  If you were prescribed an antibiotic medicine or ointment, take or apply it as told by your doctor. Do not stop using it even if your condition improves.  To help prevent scarring, make sure to cover your wound with sunscreen whenever you are outside after stitches are removed, after adhesive strips are removed, or when glue remains in place and the wound is healed. Make sure to wear a sunscreen of at least 30 SPF.  Do not scratch or pick at the wound.  Keep all follow-up visits as told by your health care provider. This is important.  Check your wound every day for signs of infection. Watch for:  Redness, swelling, or pain.  Fluid, blood, or pus.  Raise (elevate) the injured area above the level of your heart while you are sitting or lying down, if possible. SEEK MEDICAL CARE IF:  You received a tetanus shot and you have swelling, severe pain, redness, or bleeding at the injection site.  You have a fever.  A wound that was closed breaks open.  You notice a bad smell coming from your wound or your dressing.  You notice something coming out of the wound, such as wood or glass.  Your pain is not controlled with medicine.  You have increased redness, swelling, or pain at the site of your wound.  You have fluid, blood, or pus coming from your wound.  You notice a change in the color of your skin near your wound.  You need to change the dressing frequently due to fluid, blood, or pus draining from the wound.  You develop a new rash.  You develop numbness around the wound. SEEK IMMEDIATE MEDICAL CARE IF:  You develop severe swelling around the wound.  Your pain suddenly increases and is severe.  You develop painful lumps near the wound or on skin that is anywhere on your body.  You have a red streak going away  from your wound.  The wound is on your hand or foot and you cannot properly move a finger or toe.  The wound is on your hand or foot and you notice that your fingers or toes look pale or bluish.   This information is not intended to replace advice given to you by your health care provider. Make sure you discuss any questions you have with your health care provider.   Document Released: 08/02/2005 Document Revised: 12/17/2014 Document Reviewed: 07/29/2014 Elsevier Interactive Patient Education [employer]2016 Elsevier Inc.

## 2015-12-23 NOTE — ED Provider Notes (Signed)
CSN: [id number]     Arrival date & time 12/23/15  1402 History   First MD Initiated Contact with Patient 12/23/15 1418     Chief Complaint  Patient presents with  . Laceration     (Consider location/radiation/quality/duration/timing/severity/associated sxs/prior Treatment) HPI Comments: Patient is a 25 year old female who presents with laceration to left forearm. Patient states her and her boyfriend were tossing around a porcelain apple trinket and hit her arm and shattered. I am suspicious as to these details of the mechanism, however patient would not answer direct questions asked about the incident. Patient arrived within a few minutes following incident. Patient reported sharp pain in the area, and rated her pain 10/10. She was reporting numbness over the dorsum of her hand and fingers at this time. Patient denies chest pain, shortness of breath, abdominal pain, nausea, vomiting, dysuria. Tetanus not up-to-date.  Patient is a 25 y.o. female presenting with skin laceration. The history is provided by the patient.  Laceration   Past Medical History  Diagnosis Date  . Asthma    History reviewed. No pertinent past surgical history. No family history on file. Social History  Substance Use Topics  . Smoking status: Current Every Day Smoker -- 0.50 packs/day    Types: Cigarettes  . Smokeless tobacco: None  . Alcohol Use: Yes     Comment: occasional    OB History    Gravida Para Term Preterm AB TAB SAB Ectopic Multiple Living   0 0 0 0 0 0 0 0 0 0      Review of Systems  Constitutional: Negative for fever and chills.  HENT: Negative for facial swelling and sore throat.   Respiratory: Negative for shortness of breath.   Cardiovascular: Negative for chest pain.  Gastrointestinal: Negative for nausea, vomiting and abdominal pain.  Genitourinary: Negative for dysuria.  Musculoskeletal: Negative for back pain.  Skin: Positive for wound. Negative for rash.  Neurological: Negative for  headaches.  Psychiatric/Behavioral: The patient is not nervous/anxious.       Allergies  Review of patient's allergies indicates no known allergies.  Home Medications   Prior to Admission medications   Medication Sig Start Date End Date Taking? Authorizing Provider  HYDROcodone-acetaminophen (NORCO/VICODIN) 5-325 MG tablet Take 1 tablet by mouth every 6 (six) hours as needed for moderate pain. 10/26/15   Shon Batonourtney F Horton, MD  ibuprofen (ADVIL,MOTRIN) 800 MG tablet Take 1 tablet (800 mg total) by mouth 3 (three) times daily. 06/16/15   Mady GemmaElizabeth C Westfall, PA-C  methocarbamol (ROBAXIN) 500 MG tablet Take 1 tablet (500 mg total) by mouth 2 (two) times daily. 06/16/15   Mady GemmaElizabeth C Westfall, PA-C  naproxen (NAPROSYN) 500 MG tablet Take 1 tablet (500 mg total) by mouth 2 (two) times daily. 06/07/15   Vanetta MuldersScott Zackowski, MD   BP 133/86 mmHg  Pulse 97  Temp(Src) 98.6 F (37 C) (Oral)  Resp 18  Ht 5\' 9"  (1.753 m)  Wt 90.719 kg  BMI 29.52 kg/m2  SpO2 100%  LMP 11/04/2015 (Approximate) Physical Exam  Constitutional: She appears well-developed and well-nourished. No distress.  HENT:  Head: Normocephalic and atraumatic.  Mouth/Throat: Oropharynx is clear and moist. No oropharyngeal exudate.  Eyes: Conjunctivae are normal. Pupils are equal, round, and reactive to light. Right eye exhibits no discharge. Left eye exhibits no discharge. No scleral icterus.  Neck: Normal range of motion. Neck supple. No thyromegaly present.  Cardiovascular: Normal rate, regular rhythm, normal heart sounds and intact distal pulses.  Exam reveals no  gallop and no friction rub.   No murmur heard. Pulmonary/Chest: Effort normal and breath sounds normal. No stridor. No respiratory distress. She has no wheezes. She has no rales.  Abdominal: Soft. Bowel sounds are normal. She exhibits no distension. There is no tenderness. There is no rebound and no guarding.  Musculoskeletal: She exhibits no edema.       Left  forearm: She exhibits laceration.       Arms: 5 cm laceration to posterior left forearm; one large ceramic foreign body removed; on examination following pain medication and local lidocaine infiltration, patient has full sensation to fingers; flexion and extension, lateral and medial deviation intact in all fingers; and touch thumb to all fingers; cap refill <2 sec; patient reports some paresthesias to dorsum of left hand   Lymphadenopathy:    She has no cervical adenopathy.  Neurological: She is alert. Coordination normal.  Skin: Skin is warm and dry. No rash noted. She is not diaphoretic. No pallor.  Psychiatric: She has a normal mood and affect.  Nursing note and vitals reviewed.   ED Course  .Marland KitchenLaceration Repair Date/Time: 12/23/2015 5:28 PM Performed by: Emi Holes Authorized by: Emi Holes Consent: Verbal consent obtained. Risks and benefits: risks, benefits and alternatives were discussed Consent given by: patient Patient identity confirmed: verbally with patient Body area: upper extremity Location details: left lower arm Laceration length: 5 cm Foreign body present: porcelain. Tendon involvement: none Nerve involvement: Possible superficial nerve involvement, due to some paresthesia to dorsum of hand. Vascular damage: no Anesthesia: local infiltration Local anesthetic: lidocaine 2% with epinephrine Anesthetic total: 5 ml Patient sedated: no Skin closure: 5-0 Prolene Subcutaneous closure: 5-0 Vicryl Number of sutures: 15 Technique: simple Approximation: close Approximation difficulty: simple Dressing: antibiotic ointment, 4x4 sterile gauze and gauze roll Patient tolerance: Patient tolerated the procedure well with no immediate complications Comments: Assisted by Arthor Captain, PA-C   (including critical care time) Labs Review Labs Reviewed - No data to display  Imaging Review Dg Wrist Complete Left  12/23/2015  CLINICAL DATA:  Laceration along the left  lateral posterior wrist, reportedly cut with glass. Numbness in the hand. EXAM: LEFT WRIST - COMPLETE 3+ VIEW COMPARISON:  None FINDINGS: No significant bony abnormality identified. In the vicinity of the soft tissue disruption along the left posterior wrist, there is triangular 2.5 by 1.5 cm density just posterior to the radiocarpal joint suspicious for a small glass fragment. IMPRESSION: 1. Small triangular glass fragment projecting of the soft tissues posterior to the radiocarpal joint, only seen on the lateral projection. Electronically Signed   By: Gaylyn Rong M.D.   On: 12/23/2015 15:41   I have personally reviewed and evaluated these images and lab results as part of my medical decision-making.   EKG Interpretation None      After fentanyl injection and local infiltration of wound, patient calmed down and was able to tolerate procedure well. Patient's paresthesias also localized in patient was able to have full range of motion of digits and wrist following pain medication.  MDM   Tetanus updated in ED. Laceration occurred < 12 hours prior to repair. Discussed laceration care with pt and answered questions. Discussed with patient the possibility of retained foreign bodies even after irrigation and wound exploration. Pt to f-u for suture removal in 10-14 days and wound check sooner should there be signs of dehiscence or infection. Signs of infection discussed in depth. Pt is hemodynamically stable with no complaints prior to dc.  Patient vitals stable throughout ED course and patient discharged in satisfactory condition. Patient understands and is in agreement with plan. Patient also evaluated by Dr. Deretha Emory and Arthor Captain, PA-C assisted with laceration repair.   Final diagnoses:  Laceration of left forearm, initial encounter       Emi Holes, PA-C 12/23/15 1748

## 2015-12-23 NOTE — ED Notes (Signed)
Attempt to start IV to right ac. Unsuccessful.

## 2015-12-23 NOTE — ED Notes (Signed)
Laceration to her left wrist on a broken glass object. Bleeding controlled. States her hand is numb.

## 2015-12-23 NOTE — ED Provider Notes (Signed)
Medical screening examination/treatment/procedure(s) were conducted as a shared visit with non-physician practitioner(s) and myself.  I personally evaluated the patient during the encounter.   EKG Interpretation None      Results for orders placed or performed during the hospital encounter of 06/07/15  Urinalysis, Routine w reflex microscopic (not at [facility])  Result Value Ref Range   Color, Urine YELLOW YELLOW   APPearance CLEAR CLEAR   Specific Gravity, Urine 1.009 1.005 - 1.030   pH 7.5 5.0 - 8.0   Glucose, UA NEGATIVE NEGATIVE mg/dL   Hgb urine dipstick NEGATIVE NEGATIVE   Bilirubin Urine NEGATIVE NEGATIVE   Ketones, ur NEGATIVE NEGATIVE mg/dL   Protein, ur NEGATIVE NEGATIVE mg/dL   Urobilinogen, UA 0.2 0.0 - 1.0 mg/dL   Nitrite NEGATIVE NEGATIVE   Leukocytes, UA NEGATIVE NEGATIVE  Pregnancy, urine  Result Value Ref Range   Preg Test, Ur NEGATIVE NEGATIVE   Dg Wrist Complete Left  12/23/2015  CLINICAL DATA:  Laceration along the left lateral posterior wrist, reportedly cut with glass. Numbness in the hand. EXAM: LEFT WRIST - COMPLETE 3+ VIEW COMPARISON:  None FINDINGS: No significant bony abnormality identified. In the vicinity of the soft tissue disruption along the left posterior wrist, there is triangular 2.5 by 1.5 cm density just posterior to the radiocarpal joint suspicious for a small glass fragment. IMPRESSION: 1. Small triangular glass fragment projecting of the soft tissues posterior to the radiocarpal joint, only seen on the lateral projection. Electronically Signed   By: Gaylyn RongWalter  Liebkemann M.D.   On: 12/23/2015 15:41    Patient seen by me along with the PA. X-ray shows a area of the ceramic foreign body which revealed to removed in the wound on the left radial posterior wrist. Expiration of the wound did show some exposure of by radial nerve root but no injury to it and no tendon injuries. Patient does have sensation to the back of her thumb index finger and middle finger.  So no serious nerve root damage patient also has good range of motion including extension of the thumb at the distal part of the thumb as well. Patient's wound closed by the mid-level with the cutaneous interrupted stitches as well as some subcutaneous stitches to take the tension off the wound. The wound was irregular caused by cut ceramic glass. No evidence of any bony injuries. No other injuries. Patient stable for discharge home. Patient's tetanus was not up-to-date it was updated. Patient the [place] test was also negative.  No significant bleeding at all.  Vanetta MuldersScott Jordanny Waddington, MD 12/23/15 [phone]

## 2015-12-23 NOTE — ED Notes (Signed)
Patient sitting in room with significant other at the bedside. The patient has flat affect and not willing to answer any questions on own. The patient looks to significant other to answer every question.

## 2016-03-29 ENCOUNTER — Encounter (HOSPITAL_BASED_OUTPATIENT_CLINIC_OR_DEPARTMENT_OTHER): Payer: Self-pay | Admitting: *Deleted

## 2016-03-29 ENCOUNTER — Emergency Department (HOSPITAL_BASED_OUTPATIENT_CLINIC_OR_DEPARTMENT_OTHER): Payer: Medicaid Other

## 2016-03-29 ENCOUNTER — Emergency Department (HOSPITAL_BASED_OUTPATIENT_CLINIC_OR_DEPARTMENT_OTHER)
Admission: EM | Admit: 2016-03-29 | Discharge: 2016-03-30 | Disposition: A | Discharge status: Home / Self Care | Payer: Medicaid Other | Attending: Emergency Medicine | Admitting: Emergency Medicine

## 2016-03-29 DIAGNOSIS — J45909 Unspecified asthma, uncomplicated: Secondary | ICD-10-CM | POA: Insufficient documentation

## 2016-03-29 DIAGNOSIS — R109 Unspecified abdominal pain: Secondary | ICD-10-CM | POA: Diagnosis not present

## 2016-03-29 DIAGNOSIS — Z3A21 21 weeks gestation of pregnancy: Secondary | ICD-10-CM | POA: Diagnosis not present

## 2016-03-29 DIAGNOSIS — R102 Pelvic and perineal pain: Secondary | ICD-10-CM | POA: Insufficient documentation

## 2016-03-29 DIAGNOSIS — F17210 Nicotine dependence, cigarettes, uncomplicated: Secondary | ICD-10-CM | POA: Insufficient documentation

## 2016-03-29 DIAGNOSIS — O26899 Other specified pregnancy related conditions, unspecified trimester: Secondary | ICD-10-CM

## 2016-03-29 DIAGNOSIS — N760 Acute vaginitis: Secondary | ICD-10-CM | POA: Insufficient documentation

## 2016-03-29 DIAGNOSIS — O26892 Other specified pregnancy related conditions, second trimester: Secondary | ICD-10-CM | POA: Insufficient documentation

## 2016-03-29 DIAGNOSIS — B9689 Other specified bacterial agents as the cause of diseases classified elsewhere: Secondary | ICD-10-CM

## 2016-03-29 LAB — WET PREP, GENITAL
SPERM: NONE SEEN
TRICH WET PREP: NONE SEEN
YEAST WET PREP: NONE SEEN

## 2016-03-29 LAB — URINALYSIS, ROUTINE W REFLEX MICROSCOPIC
Bilirubin Urine: NEGATIVE
GLUCOSE, UA: NEGATIVE mg/dL
HGB URINE DIPSTICK: NEGATIVE
KETONES UR: NEGATIVE mg/dL
Nitrite: NEGATIVE
PH: 6.5 (ref 5.0–8.0)
PROTEIN: NEGATIVE mg/dL
Specific Gravity, Urine: 1.015 (ref 1.005–1.030)

## 2016-03-29 LAB — URINE MICROSCOPIC-ADD ON

## 2016-03-29 LAB — HCG, QUANTITATIVE, PREGNANCY: hCG, Beta Chain, Quant, S: 9424 m[IU]/mL — ABNORMAL HIGH (ref <5)

## 2016-03-29 LAB — PREGNANCY, URINE: Preg Test, Ur: POSITIVE — AB

## 2016-03-29 NOTE — ED Notes (Signed)
Patient transported to Ultrasound 

## 2016-03-29 NOTE — ED Triage Notes (Signed)
Abdominal pain for a week but worse today.

## 2016-03-29 NOTE — ED Notes (Signed)
PA at bedside at this time.  

## 2016-03-29 NOTE — ED Provider Notes (Signed)
[facility]   CSN: [id number]   By signing my name below, I, Kayla Richmond, attest that this documentation has been prepared under the direction and in the presence of Kayla Helper, PA-C. Electronically Signed: Phillis Richmond, ED Scribe. 03/29/16. 10:29 PM.  Arrival date & time: 03/29/16  2042  History   Chief Complaint Chief Complaint  Patient presents with  . Abdominal Pain   The history is provided by the patient. No language interpreter was used.   HPI Comments: Kayla Richmond is a 25 y.o. female who presents to the Emergency Department complaining of gradually worsening epigastric pain onset 3 weeks ago. Pt reports a "kicking" sensation in her stomach. She reports associated nausea. She currently rates her pain 5/10 and reports worsening pain with certain movements. She states that her LMP was in March, but she typically goes 1-2 months without a period. Pt has taken home pregnancy tests with negative results. Pt is monogamous with one partner. She does not use BC. She denies fever, chills, vomiting, diarrhea, dysuria, hematuria, vaginal bleeding, or vaginal discharge.  Past Medical History:  Diagnosis Date  . Asthma     There are no active problems to display for this patient.   History reviewed. No pertinent surgical history.  OB History    Gravida Para Term Preterm AB Living   0 0 0 0 0 0   SAB TAB Ectopic Multiple Live Births   0 0 0 0         Home Medications    Prior to Admission medications   Medication Sig Start Date End Date Taking? Authorizing Provider  HYDROcodone-acetaminophen (NORCO/VICODIN) 5-325 MG tablet Take 1 tablet by mouth every 6 (six) hours as needed for moderate pain. 10/26/15   Shon Baton, MD  ibuprofen (ADVIL,MOTRIN) 800 MG tablet Take 1 tablet (800 mg total) by mouth 3 (three) times daily. 06/16/15   Mady Gemma, PA-C  methocarbamol (ROBAXIN) 500 MG tablet Take 1 tablet (500 mg total) by mouth 2  (two) times daily. 06/16/15   Mady Gemma, PA-C  naproxen (NAPROSYN) 500 MG tablet Take 1 tablet (500 mg total) by mouth 2 (two) times daily. 06/07/15   Vanetta Mulders, MD    Family History No family history on file.  Social History Social History  Substance Use Topics  . Smoking status: Current Every Day Smoker    Packs/day: 0.50    Types: Cigarettes  . Smokeless tobacco: Never Used  . Alcohol use Yes     Comment: occasional      Allergies   Review of patient's allergies indicates no known allergies.   Review of Systems Review of Systems  Constitutional: Negative for chills and fever.  Gastrointestinal: Positive for abdominal pain and nausea. Negative for diarrhea and vomiting.  Genitourinary: Negative for dysuria, hematuria, vaginal bleeding and vaginal discharge.  All other systems reviewed and are negative.   Physical Exam Updated Vital Signs BP 130/91   Pulse 74   Temp 98 F (36.7 C) (Oral)   Resp 20   Ht 5\' 9"  (1.753 m)   Wt 197 lb (89.4 kg)   LMP 11/04/2015   SpO2 99%   BMI 29.09 kg/m   Physical Exam  Constitutional: She is oriented to person, place, and time. She appears well-developed and well-nourished.  HENT:  Head: Normocephalic and atraumatic.  Mouth/Throat: Oropharynx is clear and moist.  Eyes: Conjunctivae and EOM are normal. Pupils are equal, round, and reactive to light.  Neck: Normal range of motion. Neck supple.  Cardiovascular: Normal rate and regular rhythm.   Pulmonary/Chest: Effort normal and breath sounds normal.  Abdominal: Soft. There is generalized tenderness. There is no rebound, no guarding and no CVA tenderness.  Gravid abdomen with diffuse tenderness on palpation  Genitourinary:  Genitourinary Comments: Pelvic exam: RN in room as chaperone, external female genitalia normal with no signs of lesions or injuries. Speculum exam shows normal cervix with no obvious discharge. Bimanual exam with no adnexal tenderness, no  cervical motion tenderness, uterus normal size and nontender, no masses appreciated. The external cervical os is closed.   Musculoskeletal: Normal range of motion.  Neurological: She is alert and oriented to person, place, and time.  Skin: Skin is warm and dry.  Psychiatric: She has a normal mood and affect. Her behavior is normal.  Nursing note and vitals reviewed.    ED Treatments / Results  DIAGNOSTIC STUDIES: Oxygen Saturation is 99% on RA, normal by my interpretation.    COORDINATION OF CARE: 10:24 PM-Discussed treatment plan which includes labs, US, and pelvic exam with pt at bedside and pt agreed to plan.    Labs (all labs ordered are listed, but only abnormal results are displayed) Labs Reviewed  WET PREP, GENITAL - Abnormal; Notable for the following:       Result Value   Clue Cells Wet Prep HPF POC PRESENT (*)    WBC, Wet Prep HPF POC MANY (*)    All other components within normal limits  URINALYSIS, ROUTINE W REFLEX MICROSCOPIC (NOT AT [facility]) - Abnormal; Notable for the following:    Leukocytes, UA SMALL (*)    All other components within normal limits  PREGNANCY, URINE - Abnormal; Notable for the following:    Preg Test, Ur POSITIVE (*)    All other components within normal limits  URINE MICROSCOPIC-ADD ON - Abnormal; Notable for the following:    Squamous Epithelial / LPF 0-5 (*)    Bacteria, UA FEW (*)    All other components within normal limits  HCG, QUANTITATIVE, PREGNANCY - Abnormal; Notable for the following:    hCG, Beta Chain, Quant, S 9,424 (*)    All other components within normal limits  HIV ANTIBODY (ROUTINE TESTING)  RPR  GC/CHLAMYDIA PROBE AMP ([place]) NOT AT [facility]    EKG  EKG Interpretation None       Radiology [place] Ob Limited  Result Date: 03/30/2016 CLINICAL DATA:  Upper abdominal and epigastric pain for 1 day. Estimated gestational age by LMP is 20 weeks 6 days. EXAM: LIMITED OBSTETRIC ULTRASOUND FINDINGS: Number of Fetuses:  1  Heart Rate:  150 bpm Movement:  Fetal movement is observed. Presentation: Fetus is in cephalic presentation. Placental Location: Placenta is posterior. Previa: No previa. Amniotic Fluid (Subjective):  Within normal limits. BPD: 4.84cm 20w 4d. Note: Limited visualization of head due to fetal position. FL:  3.8 cm, 21 weeks 4 days MATERNAL FINDINGS: Cervix:  Appears closed. Uterus/Adnexae:  Limited visualization. IMPRESSION: Single intrauterine pregnancy. No acute complication is demonstrated on limited imaging. This exam is performed on an emergent basis and does not comprehensively evaluate fetal size, dating, or anatomy; follow-up complete OB US should be considered if further fetal assessment is warranted. Electronically Signed   By: Burman NievesWilliam  Stevens M.D.   On: 03/30/2016 00:13    Procedures Procedures (including critical care time)  EMERGENCY DEPARTMENT US PREGNANCY "Study: Limited Ultrasound of the Pelvis"  INDICATIONS:Pregnancy(required) Multiple views of the uterus and pelvic  cavity are obtained with a multi-frequency probe.  APPROACH:Transabdominal   PERFORMED BY: Myself  IMAGES ARCHIVED?: Yes  LIMITATIONS: Emergent procedure  PREGNANCY FREE FLUID: None  PREGNANCY UTERUS FINDINGS:Uterus enlarged and Gestational sac noted ADNEXAL FINDINGS: unremarkable  PREGNANCY FINDINGS: Intrauterine gestational sac noted and Fetal heart activity seen  INTERPRETATION: Viable intrauterine pregnancy  GESTATIONAL AGE, ESTIMATE: 21 weeks  FETAL HEART RATE: 150  COMMENT(Estimate of Gestational Age):  21 weeks      Medications Ordered in ED Medications - No data to display   Initial Impression / Assessment and Plan / ED Course  I have reviewed the triage vital signs and the nursing notes.  Pertinent labs & imaging results that were available during my care of the patient were reviewed by me and considered in my medical decision making (see chart for details).  Clinical Course    BP  130/91   Pulse 74   Temp 98 F (36.7 C) (Oral)   Resp 20   Ht 5\' 9"  (1.753 m)   Wt 89.4 kg   LMP 11/04/2015   SpO2 99%   BMI 29.09 kg/m    Final Clinical Impressions(s) / ED Diagnoses   MDM: Patient is nontoxic, nonseptic appearing, in no apparent distress. Pt with positive pregnancy test in the ED. Patient's pain and other symptoms adequately managed in emergency department. Labs, imaging and vitals reviewed.  Patient does not meet the SIRS or Sepsis criteria.  On repeat exam patient does not have a surgical abdomin and there are no peritoneal signs.  No indication of appendicitis, bowel obstruction, bowel perforation, cholecystitis, diverticulitis, PID or ectopic pregnancy.  Patient discharged home with symptomatic treatment and given strict instructions for follow-up with their primary care physician.  I have also discussed reasons to return immediately to the ER.  Patient expresses understanding and agrees with plan.    Final diagnoses:  Abdominal pain during pregnancy  BV (bacterial vaginosis)   I personally performed the services described in this documentation, which was scribed in my presence. The recorded information has been reviewed and is accurate.      New Prescriptions New Prescriptions   No medications on file   12:33 AM Pt here with vague abdominal pain.  She has a gravid abdomen. Mild tenderness to abdomen but doubt acute emergent abdominal pathology.  She is pregnant, US demonstrate an IUP at 21 wks 4 days.  She has no significant pain on pelvic exam.  Evidence of BV, will treat with flagyl.  Rocephin/zithromax given in ER for STD prophylaxis.  [facility]. She is stable for discharge.     Kayla HelperBowie Meldrick Buttery, PA-C 03/30/16 [id number]    Loren Raceravid Yelverton, MD 04/03/16 [phone]

## 2016-03-30 LAB — GC/CHLAMYDIA PROBE AMP (~~LOC~~) NOT AT ARMC
Chlamydia: NEGATIVE
Neisseria Gonorrhea: NEGATIVE

## 2016-03-30 MED ORDER — LIDOCAINE HCL (PF) 1 % IJ SOLN
INTRAMUSCULAR | Status: AC
  Administered 2016-03-30: 5 mL
  Filled 2016-03-30: qty 5

## 2016-03-30 MED ORDER — CEFTRIAXONE SODIUM 250 MG IJ SOLR
250.0000 mg | Freq: Once | INTRAMUSCULAR | Status: AC
  Administered 2016-03-30: 250 mg via INTRAMUSCULAR
  Filled 2016-03-30: qty 250

## 2016-03-30 MED ORDER — AZITHROMYCIN 250 MG PO TABS
1000.0000 mg | ORAL_TABLET | Freq: Once | ORAL | Status: AC
  Administered 2016-03-30: 1000 mg via ORAL
  Filled 2016-03-30: qty 4

## 2016-03-30 MED ORDER — METRONIDAZOLE 500 MG PO TABS: 500.0000 mg | ORAL_TABLET | Freq: Two times a day (BID) | ORAL | 0 refills | Status: DC

## 2016-03-30 NOTE — Discharge Instructions (Signed)
You are 21 weeks and 4 days pregnant.  [facility] for further management of your pregnancy.  You also have evidence of bacterial vaginosis.  Take antibiotic as prescribed for the full duration.

## 2016-03-31 LAB — HIV ANTIBODY (ROUTINE TESTING W REFLEX): HIV SCREEN 4TH GENERATION: NONREACTIVE

## 2016-03-31 LAB — RPR: RPR: NONREACTIVE

## 2016-12-22 ENCOUNTER — Emergency Department (HOSPITAL_BASED_OUTPATIENT_CLINIC_OR_DEPARTMENT_OTHER)
Admission: EM | Admit: 2016-12-22 | Discharge: 2016-12-22 | Disposition: A | Discharge status: Home / Self Care | Payer: Medicaid Other | Attending: Emergency Medicine | Admitting: Emergency Medicine

## 2016-12-22 ENCOUNTER — Encounter (HOSPITAL_BASED_OUTPATIENT_CLINIC_OR_DEPARTMENT_OTHER): Payer: Self-pay | Admitting: Emergency Medicine

## 2016-12-22 DIAGNOSIS — J45909 Unspecified asthma, uncomplicated: Secondary | ICD-10-CM | POA: Insufficient documentation

## 2016-12-22 DIAGNOSIS — F17210 Nicotine dependence, cigarettes, uncomplicated: Secondary | ICD-10-CM | POA: Diagnosis not present

## 2016-12-22 DIAGNOSIS — K08409 Partial loss of teeth, unspecified cause, unspecified class: Secondary | ICD-10-CM | POA: Insufficient documentation

## 2016-12-22 DIAGNOSIS — K0889 Other specified disorders of teeth and supporting structures: Secondary | ICD-10-CM | POA: Diagnosis present

## 2016-12-22 MED ORDER — AMOXICILLIN-POT CLAVULANATE 875-125 MG PO TABS: 1.0000 | ORAL_TABLET | Freq: Two times a day (BID) | ORAL | 0 refills | Status: AC

## 2016-12-22 MED ORDER — MAGIC MOUTHWASH: 5.0000 mL | Freq: Four times a day (QID) | ORAL | 0 refills | Status: AC | PRN

## 2016-12-22 NOTE — ED Notes (Signed)
DC instructions along with work note and Rx provided to pt as written by ED provider

## 2016-12-22 NOTE — ED Triage Notes (Signed)
Pt had right lower tooth pulled on Saturday.  Pt having increased pain and facial swelling for last 2 days.  Pt unable to chew, difficult getting fluids down.

## 2016-12-22 NOTE — ED Notes (Signed)
ED Provider at bedside. 

## 2016-12-22 NOTE — ED Provider Notes (Signed)
[facility]   CSN: [id number] Arrival date & time: 12/22/16  1026     History   Chief Complaint Chief Complaint  Patient presents with  . Dental Pain    HPI Kayla Richmond is a 26 y.o. female presenting with gradual onset worsening left sided dental pain after extraction on Saturday. She explains that she went through a routine physical for the [facility] and they extracted her left lower second to last molar. She did not have any pain prior to extraction. She was prescribed ibuprofen which she states hasn't helped her very much. She reports increased pain with cold and chewing. She has been able to stay hydrated and drink fluids. No problems swallowing or tightness in her throat. She reports one episode of vomiting as she wasn't able to eat anything and was on an empty stomach. She denies fever, chills, cold symptoms or any other symptoms.  HPI  Past Medical History:  Diagnosis Date  . Asthma     There are no active problems to display for this patient.   No past surgical history on file.  OB History    Gravida Para Term Preterm AB Living   0 0 0 0 0 0   SAB TAB Ectopic Multiple Live Births   0 0 0 0         Home Medications    Prior to Admission medications   Medication Sig Start Date End Date Taking? Authorizing Provider  ibuprofen (ADVIL,MOTRIN) 800 MG tablet Take 1 tablet (800 mg total) by mouth 3 (three) times daily. 06/16/15  Yes Glean Hess C, PA-C  amoxicillin-clavulanate (AUGMENTIN) 875-125 MG tablet Take 1 tablet by mouth every 12 (twelve) hours. 12/22/16 12/29/16  Mathews Robinsons B, PA-C  magic mouthwash SOLN Take 5 mLs by mouth 4 (four) times daily as needed for mouth pain. 12/22/16   Georgiana Shore, PA-C    Family History No family history on file.  Social History Social History  Substance Use Topics  . Smoking status: Current Every Day Smoker    Packs/day: 0.50    Types: Cigarettes  . Smokeless tobacco:  Never Used  . Alcohol use Yes     Comment: occasional      Allergies   Patient has no known allergies.   Review of Systems Review of Systems  Constitutional: Negative for chills and fever.  HENT: Positive for dental problem, ear pain and facial swelling. Negative for drooling, sinus pain, sinus pressure, sore throat, trouble swallowing and voice change.        She reports a sensation of fullness or swelling on the left side of her face but does not see swelling when she looked in the mirror.  Respiratory: Negative for choking, chest tightness and shortness of breath.      Physical Exam Updated Vital Signs BP (!) 138/92   Pulse 61   Temp 98.3 F (36.8 C) (Oral)   Resp 18   Ht 5\' 9"  (1.753 m)   Wt 99.3 kg   LMP 11/24/2016   SpO2 100%   BMI 32.34 kg/m   Physical Exam  Constitutional: She appears well-developed and well-nourished. No distress.  Patient is afebrile, nontoxic-appearing, sitting comfortably in bed in no acute distress.  HENT:  Head: Normocephalic and atraumatic.  Right Ear: External ear normal.  Left Ear: External ear normal.  Mouth/Throat: Uvula is midline and oropharynx is clear and moist. No trismus in the jaw. No dental abscesses or uvula swelling. No  oropharyngeal exudate, posterior oropharyngeal edema, posterior oropharyngeal erythema or tonsillar abscesses.    No facial swelling noted, tympanic membranes are pearly gray with visualized bony landmarks. No trismus, no obvious abscess noted, arches are symmetrical and intact, uvula is midline, sublingual mucosa is soft and nontender. No concern for Ludwig's angina. Tolerating oral secretions well.  Eyes: Conjunctivae and EOM are normal. Right eye exhibits no discharge. Left eye exhibits no discharge.  Neck: Normal range of motion. Neck supple.  Cardiovascular: Normal rate, regular rhythm and normal heart sounds.   No murmur heard. Pulmonary/Chest: Effort normal and breath sounds normal. No respiratory  distress. She has no wheezes. She has no rales.  Abdominal: Soft. She exhibits no distension.  Musculoskeletal: Normal range of motion. She exhibits no edema or deformity.  Neurological: She is alert.  Skin: Skin is warm and dry. No rash noted. She is not diaphoretic. No erythema. No pallor.  Psychiatric: She has a normal mood and affect.  Nursing note and vitals reviewed.    ED Treatments / Results  Labs (all labs ordered are listed, but only abnormal results are displayed) Labs Reviewed - No data to display  EKG  EKG Interpretation None       Radiology No results found.  Procedures Procedures (including critical care time)  Medications Ordered in ED Medications - No data to display   Initial Impression / Assessment and Plan / ED Course  I have reviewed the triage vital signs and the nursing notes.  Pertinent labs & imaging results that were available during my care of the patient were reviewed by me and considered in my medical decision making (see chart for details).     Patient presents with left-sided lower dental pain after extraction. No fever, chills. Tolerating oral secretions well.  No gross abscess.  Exam unconcerning for Ludwig's angina or spread of infection.  Will treat with penicillin and pain medicine.  Urged patient to follow-up with dentist.    Discussed strict return precautions and advised to return to the emergency department if experiencing any new or worsening symptoms. Instructions were understood and patient agreed with discharge plan.   Final Clinical Impressions(s) / ED Diagnoses   Final diagnoses:  Pain, dental    New Prescriptions New Prescriptions   AMOXICILLIN-CLAVULANATE (AUGMENTIN) 875-125 MG TABLET    Take 1 tablet by mouth every 12 (twelve) hours.   MAGIC MOUTHWASH SOLN    Take 5 mLs by mouth 4 (four) times daily as needed for mouth pain.     Georgiana ShoreMitchell, Somya Jauregui B, PA-C 12/22/16 1159    Melene PlanFloyd, Dan, DO 12/22/16 [phone]

## 2016-12-22 NOTE — Discharge Instructions (Signed)
As discussed, take your antibiotics until completed even if you feel better. Drink plenty of fluids to keep yourself hydrated and keep your urine clear. Continue your ibuprofen prescribed after your dental extraction but take your 800mg  3 times a day as needed (NOT 1600 mg (2 pills) every 4 hours). Try to take it with soft food such as yogurt. Avoid smoking.  Follow-up with your dentist.  Return to the emergency department if you cannot swallow, Difficulty breathing, increased swelling, increased pain, fever, chills or any other new concerning symptoms in the meantime.

## 2018-02-21 IMAGING — US US OB LIMITED
1 series · 9 of 9 positions shown · non-contrast
Comparison: none

CLINICAL DATA: Upper abdominal and epigastric pain for 1 day.
Estimated gestational age by LMP is 20 weeks 6 days.

EXAM:
LIMITED OBSTETRIC ULTRASOUND

[Series 1: us ob limited · 0.24mm/px · 9 acquisitions, 9 frames shown]
[im 1/9]
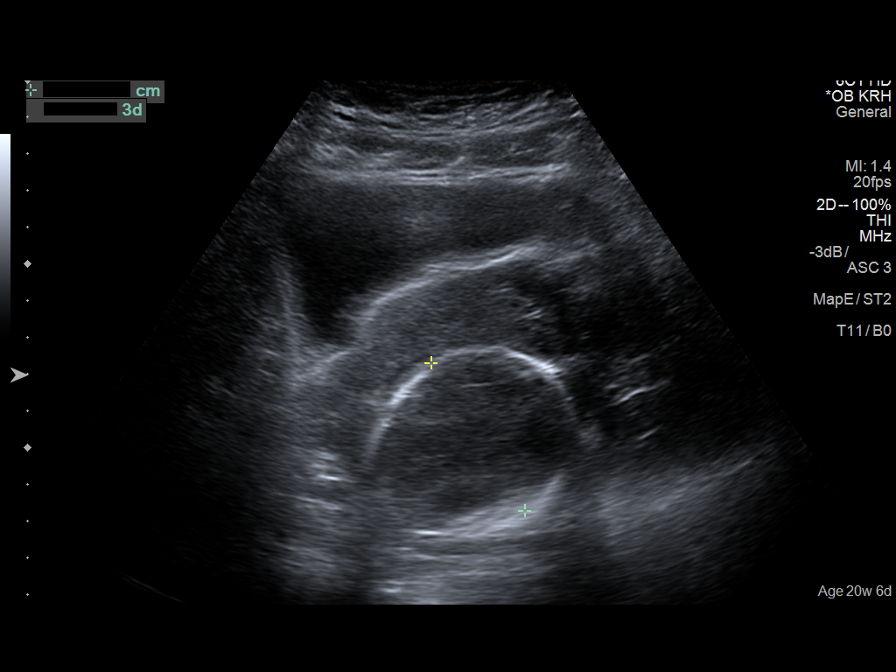
[im 2/9]
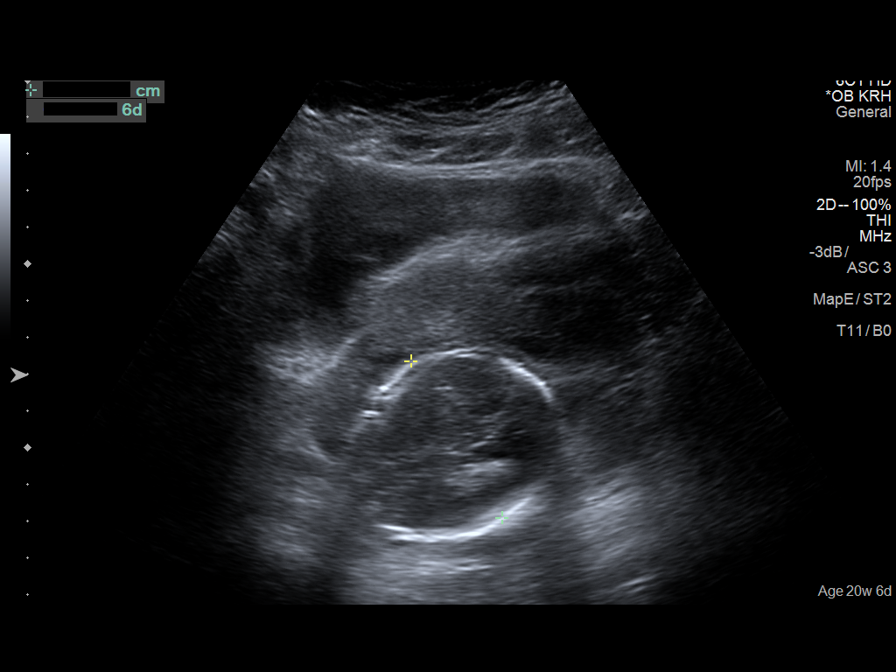
[im 3/9]
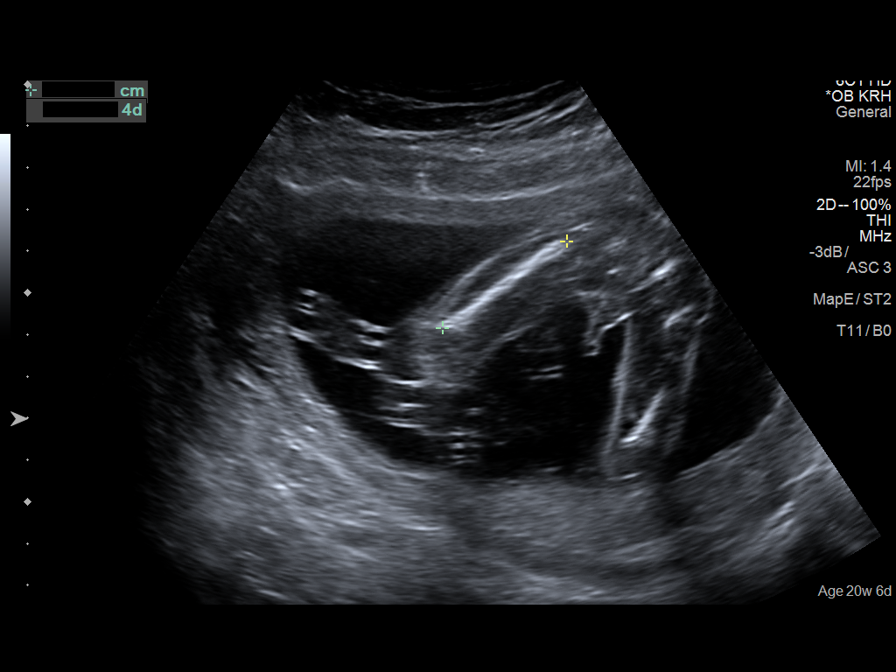
[im 4/9]
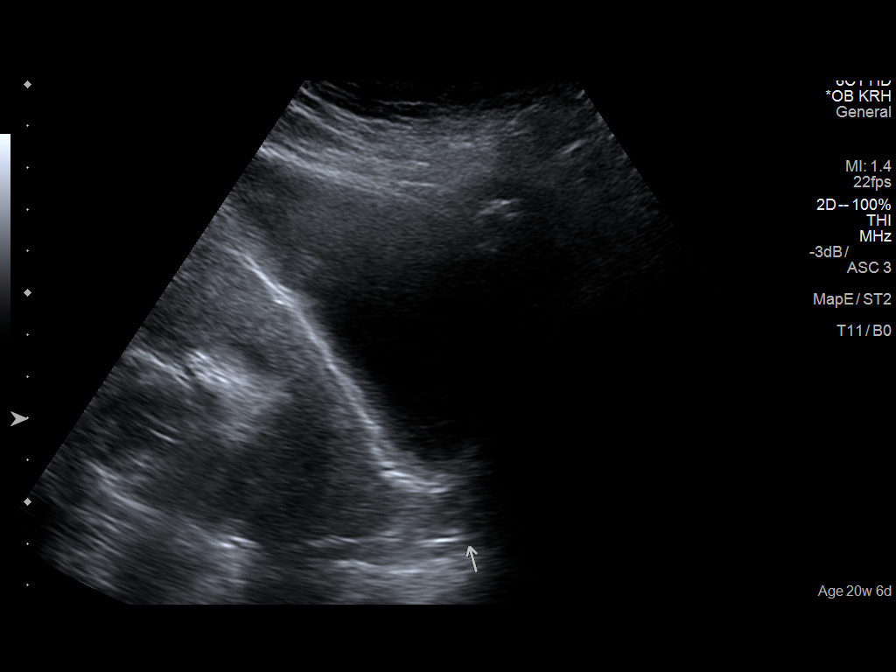
[im 5/9]
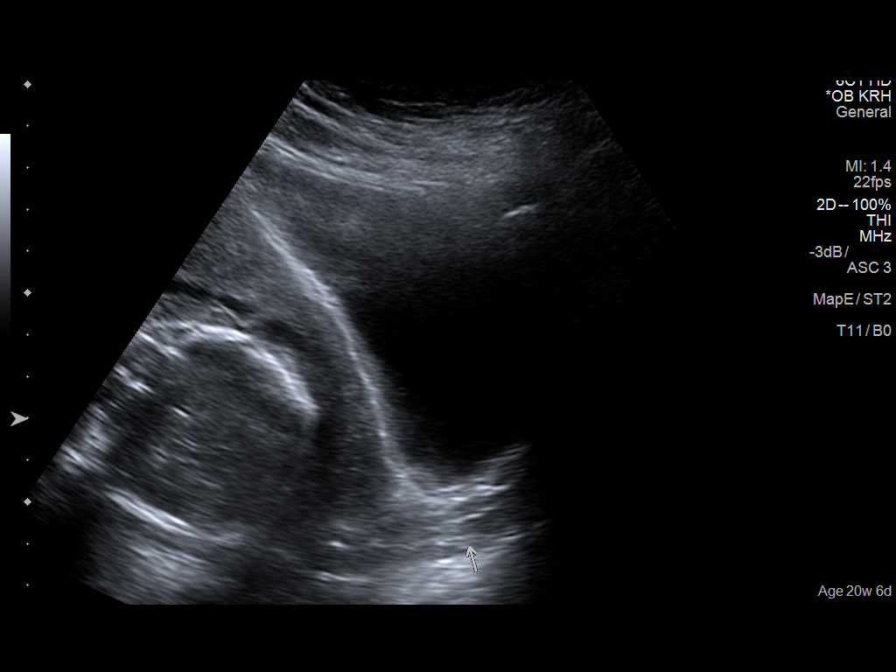
[im 6/9]
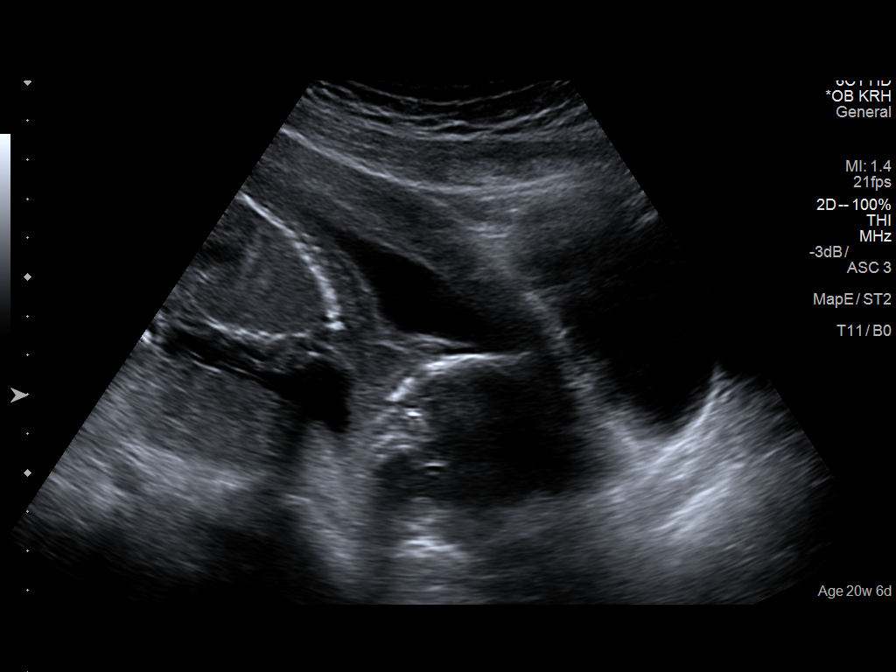
[im 7/9]
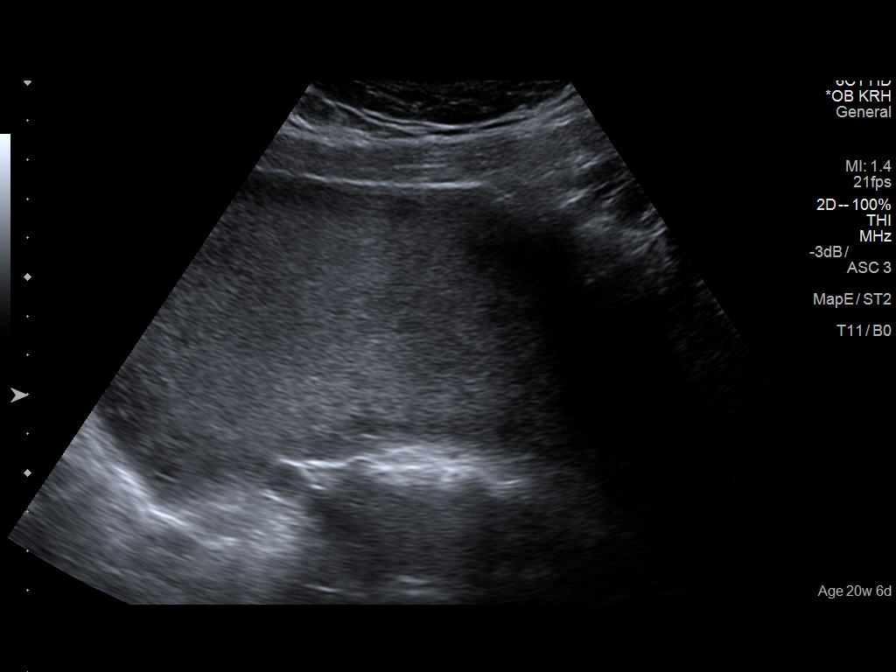
[im 8/9]
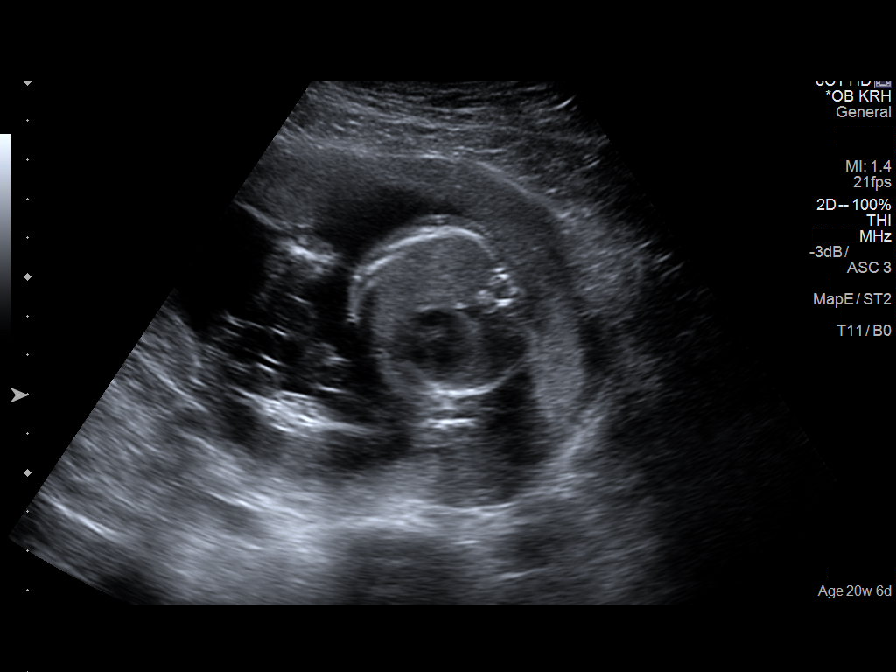
[im 9/9]
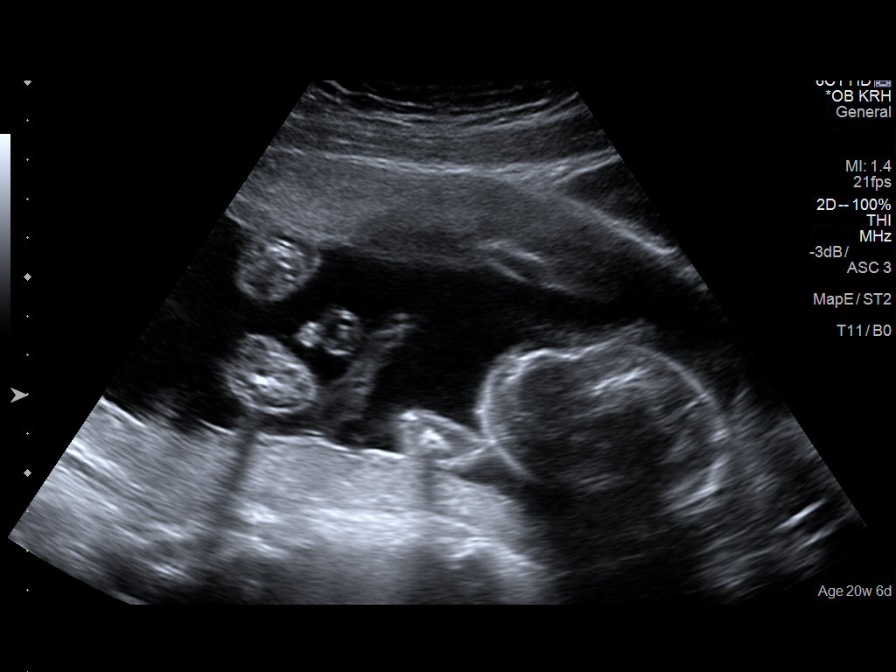

[9 of 9 positions shown; findings below may reference images not displayed]

FINDINGS: Number of Fetuses:  1

Heart Rate:  150 bpm

Movement:  Fetal movement is observed.

Presentation: Fetus is in cephalic presentation.

Placental Location: Placenta is posterior.

Previa: No previa.

Amniotic Fluid (Subjective):  Within normal limits.

BPD: 4.84cm 20w 4d. Note: Limited visualization of head due to fetal
position.

FL:  3.8 cm, 21 weeks 4 days

MATERNAL FINDINGS:

Cervix:  Appears closed.

Uterus/Adnexae:  Limited visualization.
IMPRESSION: Single intrauterine pregnancy. No acute complication is demonstrated
on limited imaging.

This exam is performed on an emergent basis and does not
comprehensively evaluate fetal size, dating, or anatomy; follow-up
complete OB US should be considered if further fetal assessment is
warranted.

## 2023-11-25 ENCOUNTER — Ambulatory Visit (HOSPITAL_COMMUNITY)
Admission: EM | Admit: 2023-11-25 | Discharge: 2023-11-25 | Disposition: A | Discharge status: Home / Self Care | Attending: Physician Assistant | Admitting: Physician Assistant

## 2023-11-25 ENCOUNTER — Encounter (HOSPITAL_COMMUNITY): Payer: Self-pay | Admitting: *Deleted

## 2023-11-25 ENCOUNTER — Other Ambulatory Visit: Payer: Self-pay

## 2023-11-25 DIAGNOSIS — R112 Nausea with vomiting, unspecified: Secondary | ICD-10-CM

## 2023-11-25 DIAGNOSIS — R1084 Generalized abdominal pain: Secondary | ICD-10-CM | POA: Diagnosis not present

## 2023-11-25 DIAGNOSIS — R197 Diarrhea, unspecified: Secondary | ICD-10-CM

## 2023-11-25 MED ORDER — ONDANSETRON 4 MG PO TBDP
4.0000 mg | ORAL_TABLET | Freq: Once | ORAL | Status: AC
  Administered 2023-11-25: 4 mg via ORAL

## 2023-11-25 MED ORDER — ONDANSETRON 4 MG PO TBDP
ORAL_TABLET | ORAL | Status: AC
  Filled 2023-11-25: qty 1

## 2023-11-25 NOTE — ED Notes (Signed)
 Patient is being discharged from the Urgent Care and sent to the Emergency Department via personal opperated vehicle . Per Elson Areas PA, patient is in need of higher level of care due to abdominal pain. Patient is aware and verbalizes understanding of plan of care.  Vitals:   11/25/23 2015  BP: (!) 128/94  Pulse: (!) 103  Resp: 20  Temp: 98.3 F (36.8 C)  SpO2: 98%

## 2023-11-25 NOTE — Discharge Instructions (Signed)
Go to the Emergency department for evaltuion

## 2023-11-25 NOTE — ED Provider Notes (Signed)
 [facility]    CSN: [id number] Arrival date & time: 11/25/23  1914      History   Chief Complaint Chief Complaint  Patient presents with   Abdominal Pain   Emesis    HPI Kayla Richmond is a 33 y.o. female.   Patient complains of lower abdominal pain nausea vomiting and diarrhea that started today.  Patient denies any fever or chills.  Patient reports she is not able to drink fluids.  Patient denies any gynecology check problems she denies any possibility of pregnancy.  Patient does not think she has had a fever or chills.  The history is provided by the patient. No language interpreter was used.  Abdominal Pain Associated symptoms: vomiting   Emesis Associated symptoms: abdominal pain     Past Medical History:  Diagnosis Date   Asthma     There are no active problems to display for this patient.   History reviewed. No pertinent surgical history.  OB History     Gravida  0   Para  0   Term  0   Preterm  0   AB  0   Living  0      SAB  0   IAB  0   Ectopic  0   Multiple  0   Live Births               Home Medications    Prior to Admission medications   Medication Sig Start Date End Date Taking? Authorizing Provider  ibuprofen (ADVIL,MOTRIN) 800 MG tablet Take 1 tablet (800 mg total) by mouth 3 (three) times daily. 06/16/15   Mady Gemma, PA-C  magic mouthwash SOLN Take 5 mLs by mouth 4 (four) times daily as needed for mouth pain. 12/22/16   Georgiana Shore PA-C    Family History History reviewed. No pertinent family history.  Social History Social History   Tobacco Use   Smoking status: Every Day    Current packs/day: 0.50    Types: Cigarettes   Smokeless tobacco: Never  Substance Use Topics   Alcohol use: Yes    Comment: occasional    Drug use: No     Allergies   Patient has no known allergies.   Review of Systems Review of Systems  Gastrointestinal:  Positive for abdominal pain and  vomiting.  All other systems reviewed and are negative.    Physical Exam Triage Vital Signs ED Triage Vitals  Encounter Vitals Group     BP 11/25/23 2015 (!) 128/94     Systolic BP Percentile --      Diastolic BP Percentile --      Pulse Rate 11/25/23 2015 (!) 103     Resp 11/25/23 2015 20     Temp 11/25/23 2015 98.3 F (36.8 C)     Temp src --      SpO2 11/25/23 2015 98 %     Weight --      Height --      Head Circumference --      Peak Flow --      Pain Score 11/25/23 2012 10     Pain Loc --      Pain Education --      Exclude from Growth Chart --    No data found.  Updated Vital Signs BP (!) 128/94   Pulse (!) 103   Temp 98.3 F (36.8 C)   Resp 20   LMP 10/22/2023  SpO2 98%   Visual Acuity Right Eye Distance:   Left Eye Distance:   Bilateral Distance:    Right Eye Near:   Left Eye Near:    Bilateral Near:     Physical Exam Vitals and nursing note reviewed.  Constitutional:      Appearance: She is well-developed.  HENT:     Head: Normocephalic.  Cardiovascular:     Rate and Rhythm: Normal rate.  Pulmonary:     Effort: Pulmonary effort is normal.  Abdominal:     General: Bowel sounds are normal. There is no distension.     Palpations: Abdomen is soft.     Tenderness: There is abdominal tenderness.  Musculoskeletal:        General: Normal range of motion.     Cervical back: Normal range of motion.  Skin:    General: Skin is warm.  Neurological:     General: No focal deficit present.     Mental Status: She is alert and oriented to person, place, and time.      UC Treatments / Results  Labs (all labs ordered are listed, but only abnormal results are displayed) Labs Reviewed - No data to display  EKG   Radiology No results found.  Procedures Procedures (including critical care time)  Medications Ordered in UC Medications  ondansetron (ZOFRAN-ODT) disintegrating tablet 4 mg (4 mg Oral Given 11/25/23 2039)    Initial Impression /  Assessment and Plan / UC Course  I have reviewed the triage vital signs and the nursing notes.  Pertinent labs & imaging results that were available during my care of the patient were reviewed by me and considered in my medical decision making (see chart for details).     Pt given odt zofran for nausea.  Pt reports having bad pain.  Pt to ED for evaluation  Final Clinical Impressions(s) / UC Diagnoses   Final diagnoses:  Generalized abdominal pain  Nausea vomiting and diarrhea   Discharge Instructions   None    ED Prescriptions   None    PDMP not reviewed this encounter. An After Visit Summary was printed and given to the patient.       Elson Areas, [place] 11/25/23 2053

## 2023-11-25 NOTE — ED Triage Notes (Signed)
 PT reports vomiting and ABD pain started today. PT vomiting during triage.

## 2023-11-25 NOTE — ED Notes (Signed)
 Patient is being discharged from the Urgent Care and sent to the Emergency Department via personal opperated vehicle . Per Langston Masker PA, patient is in need of higher level of care due to abdominal pain. Patient is aware and verbalizes understanding of plan of care.  Vitals:   11/25/23 2015  BP: (!) 128/94  Pulse: (!) 103  Resp: 20  Temp: 98.3 F (36.8 C)  SpO2: 98%

## 2024-01-10 ENCOUNTER — Ambulatory Visit: Admitting: Family

## 2024-01-17 ENCOUNTER — Encounter (HOSPITAL_COMMUNITY): Payer: Self-pay | Admitting: Emergency Medicine

## 2024-01-17 ENCOUNTER — Emergency Department (HOSPITAL_COMMUNITY): Admission: EM | Admit: 2024-01-17 | Discharge: 2024-01-17 | Disposition: A | Discharge status: Home / Self Care

## 2024-01-17 ENCOUNTER — Emergency Department (HOSPITAL_COMMUNITY)

## 2024-01-17 ENCOUNTER — Other Ambulatory Visit: Payer: Self-pay

## 2024-01-17 DIAGNOSIS — R600 Localized edema: Secondary | ICD-10-CM | POA: Diagnosis present

## 2024-01-17 LAB — COMPREHENSIVE METABOLIC PANEL WITH GFR
ALT: 14 U/L (ref 0–44)
AST: 17 U/L (ref 15–41)
Albumin: 3.2 g/dL — ABNORMAL LOW (ref 3.5–5.0)
Alkaline Phosphatase: 45 U/L (ref 38–126)
Anion gap: 5 (ref 5–15)
BUN: 10 mg/dL (ref 6–20)
CO2: 25 mmol/L (ref 22–32)
Calcium: 8.4 mg/dL — ABNORMAL LOW (ref 8.9–10.3)
Chloride: 107 mmol/L (ref 98–111)
Creatinine, Ser: 0.95 mg/dL (ref 0.44–1.00)
GFR, Estimated: >60 mL/min (ref >60)
Glucose, Bld: 80 mg/dL (ref 70–99)
Potassium: 4 mmol/L (ref 3.5–5.1)
Sodium: 137 mmol/L (ref 135–145)
Total Bilirubin: 0.5 mg/dL (ref 0.0–1.2)
Total Protein: 6 g/dL — ABNORMAL LOW (ref 6.5–8.1)

## 2024-01-17 LAB — CBC WITH DIFFERENTIAL/PLATELET
Abs Immature Granulocytes: 0.03 10*3/uL (ref 0.00–0.07)
Basophils Absolute: 0 10*3/uL (ref 0.0–0.1)
Basophils Relative: 0 %
Eosinophils Absolute: 0.1 10*3/uL (ref 0.0–0.5)
Eosinophils Relative: 1 %
HCT: 40.1 % (ref 36.0–46.0)
Hemoglobin: 12.7 g/dL (ref 12.0–15.0)
Immature Granulocytes: 0 %
Lymphocytes Relative: 34 %
Lymphs Abs: 2.3 10*3/uL (ref 0.7–4.0)
MCH: 27.7 pg (ref 26.0–34.0)
MCHC: 31.7 g/dL (ref 30.0–36.0)
MCV: 87.4 fL (ref 80.0–100.0)
Monocytes Absolute: 0.5 10*3/uL (ref 0.1–1.0)
Monocytes Relative: 7 %
Neutro Abs: 4 10*3/uL (ref 1.7–7.7)
Neutrophils Relative %: 58 %
Platelets: 229 10*3/uL (ref 150–400)
RBC: 4.59 MIL/uL (ref 3.87–5.11)
RDW: 14.4 % (ref 11.5–15.5)
WBC: 6.9 10*3/uL (ref 4.0–10.5)
nRBC: 0 % (ref 0.0–0.2)

## 2024-01-17 LAB — HCG, SERUM, QUALITATIVE: Preg, Serum: NEGATIVE

## 2024-01-17 NOTE — Discharge Instructions (Addendum)
 Thank you for letting us  evaluate you today.  I think that your swelling is likely secondary to venous insufficiency/fluid pooling in legs due to gravity.  However, I provided you with ultrasound instructions tomorrow to rule out a clot in both legs.  Please follow instructions as provided  Return to emergency department if you experience chest pain, shortness of breath, red, swollen, infected looking legs or cool pale legs, red hot swollen joints.

## 2024-01-17 NOTE — ED Provider Notes (Signed)
 [place] [facility] Provider Note   CSN: [id number] Arrival date & time: 01/17/24  1603     History  Chief Complaint  Patient presents with   Ankle Pain    Kayla Richmond is a 33 y.o. female with past medical history of asthma presents emergency department for evaluation of bilateral lower extremity pain and swelling that started on Saturday.  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  Reports that swelling was worse on Saturday but significantly improved following resting and elevation.  No known precipitating event or trauma.  Denies recent surgery, travel, previous DVT/PE, chest pain, shortness of breath, nor OCP/recent medications   Ankle Pain Associated symptoms: no fatigue and no fever        Home Medications Prior to Admission medications   Medication Sig Start Date End Date Taking? Authorizing Provider  ibuprofen  (ADVIL ,MOTRIN ) 800 MG tablet Take 1 tablet (800 mg total) by mouth 3 (three) times daily. 06/16/15   Kriss Peter, PA-C  magic mouthwash SOLN Take 5 mLs by mouth 4 (four) times daily as needed for mouth pain. 12/22/16   Mitchell, Jessica B, PA-C      Allergies    Patient has no known allergies.    Review of Systems   Review of Systems  Constitutional:  Negative for chills, fatigue and fever.  Respiratory:  Negative for cough, chest tightness, shortness of breath and wheezing.   Cardiovascular:  Negative for chest pain and palpitations.  Gastrointestinal:  Negative for abdominal pain, constipation, diarrhea, nausea and vomiting.  Neurological:  Negative for dizziness, seizures, weakness, light-headedness, numbness and headaches.    Physical Exam Updated Vital Signs BP (!) 138/93 (BP Location: Right Arm)   Pulse 71   Temp 98.3 F (36.8 C)   Resp 15   Wt 107.5 kg   LMP 12/30/2023 (Approximate)   SpO2 100%   BMI 35.00 kg/m  Physical Exam Vitals and nursing note reviewed.   Constitutional:      General: She is not in acute distress.    Appearance: Normal appearance.  HENT:     Head: Normocephalic and atraumatic.  Eyes:     Conjunctiva/sclera: Conjunctivae normal.  Cardiovascular:     Rate and Rhythm: Normal rate.     Pulses:          Dorsalis pedis pulses are 2+ on the right side and 2+ on the left side.  Pulmonary:     Effort: Pulmonary effort is normal. No respiratory distress.  Musculoskeletal:     Comments: Diffuse tenderness of BLE from calf to ankle.  Mild 1+ swelling without pitting edema.  No overlying skin changes such as erythema, warmth, pallor.  No significant joint swelling or signs of infection. ROM of bilateral hips, knees, and ankles WNL. DP 2+ equally  Skin:    Coloration: Skin is not jaundiced or pale.  Neurological:     Mental Status: She is alert. Mental status is at baseline.     ED Results / Procedures / Treatments   Labs (all labs ordered are listed, but only abnormal results are displayed) Labs Reviewed  COMPREHENSIVE METABOLIC PANEL WITH GFR - Abnormal; Notable for the following components:      Result Value   Calcium 8.4 (*)    Total Protein 6.0 (*)    Albumin 3.2 (*)    All other components within normal limits  CBC WITH DIFFERENTIAL/PLATELET  HCG, SERUM,  QUALITATIVE    EKG None  Radiology DG Ankle Complete Left Result Date: 01/17/2024 CLINICAL DATA:  Swelling and pain. Bilateral ankle pain and swelling for 1 week. No injury. EXAM: LEFT ANKLE COMPLETE - 3+ VIEW COMPARISON:  10/25/2015 FINDINGS: Left ankle appears intact. No evidence of acute fracture or subluxation. No focal bone lesion or bone destruction. Bone cortex and trabecular architecture appear intact. Moderate soft tissue swelling about the left ankle. No radiopaque soft tissue foreign bodies. IMPRESSION: Soft tissue swelling about the left ankle. No acute bony abnormalities. Electronically Signed   By: Boyce Byes M.D.   On: 01/17/2024 17:55   DG  Ankle Complete Right Result Date: 01/17/2024 CLINICAL DATA:  Swelling and pain. Bilateral ankle pain and swelling for 1 week, worse this afternoon. No injury. EXAM: RIGHT ANKLE - COMPLETE 3+ VIEW COMPARISON:  None Available. FINDINGS: Right ankle appears intact. No evidence of acute fracture or subluxation. No focal bone lesion or bone destruction. Bone cortex and trabecular architecture appear intact. Mild soft tissue swelling most prominent medially. No radiopaque soft tissue foreign bodies. IMPRESSION: Medial soft tissue swelling about the right ankle. No acute bony abnormalities. Electronically Signed   By: Boyce Byes M.D.   On: 01/17/2024 17:54    Procedures Procedures    Medications Ordered in ED Medications - No data to display  ED Course/ Medical Decision Making/ A&P                                 Medical Decision Making Amount and/or Complexity of Data Reviewed Labs: ordered. Radiology: ordered.   Patient presents to the ED for concern of BLE edema, this involves an extensive number of treatment options, and is a complaint that carries with it a high risk of complications and morbidity.  The differential diagnosis includes cellulitis, DVT, venous insufficiency, medication side effect, lymphedema, injury   Co morbidities that complicate the patient evaluation  See HPI   Additional history obtained:  Additional history obtained from Nursing   External records from outside source obtained and reviewed including RN note    Problem List / ED Course:  BLE edema Symptoms improved following rest and elevation Low suspicion for infection/cellulitis nor septic joint with no warmth, erythema, streaking, significant joint swelling or severe joint pain with movement Low suspicion for PE. no CP, SHOB. Lung sounds CTAB. PERC negative No new medications nor CCB No gross effusion noted on XR of bilateral ankles Will provide outpatient US  to r/o bilateral DVT. Instructions  provided on DC paperwork and verbally discussed with patient. Provided recommendations for venous insufficiency to include rest, elevation, compression socks   Reevaluation:  After the interventions noted above, I reevaluated the patient and found that they have :stayed the same    Dispostion:  After consideration of the diagnostic results and the patients response to treatment, I feel that the patent would benefit from outpatient management with PCP establishment, outpatient US  to r/o DVT.   Discussed ED workup, disposition, strict return to ED precautions with patient who expresses understanding agrees with plan.  All questions answered to their satisfaction.  They are agreeable to plan.  Discharge instructions provided on paperwork  Final Clinical Impression(s) / ED Diagnoses Final diagnoses:  Pedal edema    Rx / DC Orders ED Discharge Orders     None         Royann Cords, PA 01/17/24 2220  Carin Charleston, MD 01/17/24 2351

## 2024-01-17 NOTE — ED Triage Notes (Signed)
 Patient endorses bilateral ankle pain and swelling since Saturday.  Patient denies trauma. Patient gives verbal consent for MSE.

## 2024-01-18 ENCOUNTER — Ambulatory Visit (HOSPITAL_COMMUNITY)
Admission: RE | Admit: 2024-01-18 | Discharge: 2024-01-18 | Disposition: A | Discharge status: Home / Self Care | Source: Ambulatory Visit | Attending: Emergency Medicine | Admitting: Emergency Medicine

## 2024-01-18 DIAGNOSIS — M79604 Pain in right leg: Secondary | ICD-10-CM | POA: Diagnosis present

## 2024-01-18 DIAGNOSIS — M79605 Pain in left leg: Secondary | ICD-10-CM | POA: Diagnosis present

## 2024-01-18 DIAGNOSIS — R609 Edema, unspecified: Secondary | ICD-10-CM | POA: Diagnosis not present

## 2024-01-18 DIAGNOSIS — R600 Localized edema: Secondary | ICD-10-CM | POA: Insufficient documentation
